# Patient Record
Sex: Female | Born: 1945 | Race: Black or African American | Hispanic: No | Smoking: Current every day smoker
Health system: Southern US, Community
[De-identification: ages and names within clinical notes are randomized; demographics above are authoritative.]

## PROBLEM LIST (undated history)

## (undated) DIAGNOSIS — R7989 Other specified abnormal findings of blood chemistry: Principal | ICD-10-CM

## (undated) DIAGNOSIS — J189 Pneumonia, unspecified organism: Secondary | ICD-10-CM

## (undated) DIAGNOSIS — M5430 Sciatica, unspecified side: Secondary | ICD-10-CM

## (undated) DIAGNOSIS — M797 Fibromyalgia: Secondary | ICD-10-CM

## (undated) DIAGNOSIS — J449 Chronic obstructive pulmonary disease, unspecified: Secondary | ICD-10-CM

## (undated) DIAGNOSIS — M545 Low back pain, unspecified: Secondary | ICD-10-CM

## (undated) DIAGNOSIS — G2581 Restless legs syndrome: Secondary | ICD-10-CM

## (undated) DIAGNOSIS — J42 Unspecified chronic bronchitis: Secondary | ICD-10-CM

## (undated) DIAGNOSIS — G8929 Other chronic pain: Secondary | ICD-10-CM

## (undated) DIAGNOSIS — F419 Anxiety disorder, unspecified: Secondary | ICD-10-CM

## (undated) DIAGNOSIS — B1920 Unspecified viral hepatitis C without hepatic coma: Secondary | ICD-10-CM

## (undated) DIAGNOSIS — Z9289 Personal history of other medical treatment: Secondary | ICD-10-CM

## (undated) DIAGNOSIS — R52 Pain, unspecified: Secondary | ICD-10-CM

## (undated) DIAGNOSIS — M1990 Unspecified osteoarthritis, unspecified site: Secondary | ICD-10-CM

## (undated) DIAGNOSIS — G43909 Migraine, unspecified, not intractable, without status migrainosus: Secondary | ICD-10-CM

## (undated) HISTORY — PX: MASS EXCISION: SHX2000

## (undated) HISTORY — DX: Personal history of other medical treatment: Z92.89

## (undated) HISTORY — PX: JOINT REPLACEMENT: SHX530

## (undated) HISTORY — PX: TIBIAL SESAMOID EXCISION: SHX2523

## (undated) HISTORY — DX: Other specified abnormal findings of blood chemistry: R79.89

## (undated) HISTORY — PX: ARTHRODESIS METATARSALPHALANGEAL JOINT (MTPJ): SHX6566

---
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
## 1979-08-30 HISTORY — PX: BILATERAL CARPAL TUNNEL RELEASE: SHX6508

## 1979-08-30 HISTORY — PX: VAGINAL HYSTERECTOMY: SUR661

## 1989-08-29 DIAGNOSIS — B1920 Unspecified viral hepatitis C without hepatic coma: Secondary | ICD-10-CM

## 1989-08-29 HISTORY — DX: Unspecified viral hepatitis C without hepatic coma: B19.20

## 1998-05-24 ENCOUNTER — Ambulatory Visit (HOSPITAL_BASED_OUTPATIENT_CLINIC_OR_DEPARTMENT_OTHER): Admission: RE | Admit: 1998-05-24 | Discharge: 1998-05-24 | Payer: Self-pay | Admitting: Surgery

## 1999-08-09 ENCOUNTER — Ambulatory Visit (HOSPITAL_COMMUNITY): Admission: RE | Admit: 1999-08-09 | Discharge: 1999-08-09 | Payer: Self-pay | Admitting: Gastroenterology

## 1999-10-22 ENCOUNTER — Ambulatory Visit (HOSPITAL_BASED_OUTPATIENT_CLINIC_OR_DEPARTMENT_OTHER): Admission: RE | Admit: 1999-10-22 | Discharge: 1999-10-22 | Payer: Self-pay | Admitting: Orthopedic Surgery

## 2000-05-08 ENCOUNTER — Emergency Department (HOSPITAL_COMMUNITY): Admission: EM | Admit: 2000-05-08 | Discharge: 2000-05-08 | Payer: Self-pay

## 2000-06-14 ENCOUNTER — Emergency Department (HOSPITAL_COMMUNITY): Admission: EM | Admit: 2000-06-14 | Discharge: 2000-06-14 | Payer: Self-pay

## 2000-12-08 ENCOUNTER — Ambulatory Visit (HOSPITAL_BASED_OUTPATIENT_CLINIC_OR_DEPARTMENT_OTHER): Admission: RE | Admit: 2000-12-08 | Discharge: 2000-12-08 | Payer: Self-pay | Admitting: Orthopedic Surgery

## 2002-04-04 ENCOUNTER — Ambulatory Visit (HOSPITAL_COMMUNITY): Admission: RE | Admit: 2002-04-04 | Discharge: 2002-04-04 | Payer: Self-pay | Admitting: Family Medicine

## 2002-04-04 ENCOUNTER — Encounter: Payer: Self-pay | Admitting: Family Medicine

## 2002-09-29 ENCOUNTER — Encounter: Admission: RE | Admit: 2002-09-29 | Discharge: 2002-09-29 | Payer: Self-pay | Admitting: Orthopaedic Surgery

## 2002-09-29 ENCOUNTER — Encounter: Payer: Self-pay | Admitting: Orthopaedic Surgery

## 2003-02-17 ENCOUNTER — Ambulatory Visit (HOSPITAL_COMMUNITY): Admission: RE | Admit: 2003-02-17 | Discharge: 2003-02-17 | Payer: Self-pay | Admitting: *Deleted

## 2003-02-17 ENCOUNTER — Encounter: Payer: Self-pay | Admitting: *Deleted

## 2003-08-23 ENCOUNTER — Encounter: Admission: RE | Admit: 2003-08-23 | Discharge: 2003-08-23 | Payer: Self-pay | Admitting: *Deleted

## 2003-08-23 ENCOUNTER — Encounter: Payer: Self-pay | Admitting: *Deleted

## 2005-02-20 ENCOUNTER — Encounter: Admission: RE | Admit: 2005-02-20 | Discharge: 2005-02-20 | Payer: Self-pay | Admitting: Internal Medicine

## 2005-06-03 ENCOUNTER — Ambulatory Visit (HOSPITAL_COMMUNITY): Admission: RE | Admit: 2005-06-03 | Discharge: 2005-06-03 | Payer: Self-pay | Admitting: Podiatry

## 2005-06-03 ENCOUNTER — Encounter (INDEPENDENT_AMBULATORY_CARE_PROVIDER_SITE_OTHER): Payer: Self-pay | Admitting: *Deleted

## 2005-06-03 ENCOUNTER — Ambulatory Visit (HOSPITAL_BASED_OUTPATIENT_CLINIC_OR_DEPARTMENT_OTHER): Admission: RE | Admit: 2005-06-03 | Discharge: 2005-06-03 | Payer: Self-pay | Admitting: Podiatry

## 2007-02-05 ENCOUNTER — Emergency Department (HOSPITAL_COMMUNITY): Admission: EM | Admit: 2007-02-05 | Discharge: 2007-02-05 | Payer: Self-pay | Admitting: Family Medicine

## 2009-01-17 ENCOUNTER — Encounter: Admission: RE | Admit: 2009-01-17 | Discharge: 2009-01-17 | Payer: Self-pay | Admitting: Internal Medicine

## 2009-01-19 ENCOUNTER — Inpatient Hospital Stay (HOSPITAL_COMMUNITY): Admission: EM | Admit: 2009-01-19 | Discharge: 2009-01-25 | Payer: Self-pay | Admitting: Emergency Medicine

## 2009-02-21 ENCOUNTER — Ambulatory Visit: Payer: Self-pay | Admitting: Pulmonary Disease

## 2009-02-21 DIAGNOSIS — J309 Allergic rhinitis, unspecified: Secondary | ICD-10-CM | POA: Insufficient documentation

## 2009-02-21 DIAGNOSIS — F17200 Nicotine dependence, unspecified, uncomplicated: Secondary | ICD-10-CM | POA: Insufficient documentation

## 2009-02-21 DIAGNOSIS — I10 Essential (primary) hypertension: Secondary | ICD-10-CM | POA: Insufficient documentation

## 2009-02-21 DIAGNOSIS — J9819 Other pulmonary collapse: Secondary | ICD-10-CM | POA: Insufficient documentation

## 2009-02-21 DIAGNOSIS — J449 Chronic obstructive pulmonary disease, unspecified: Secondary | ICD-10-CM

## 2009-08-13 ENCOUNTER — Encounter: Admission: RE | Admit: 2009-08-13 | Discharge: 2009-08-13 | Payer: Self-pay | Admitting: General Surgery

## 2009-08-16 ENCOUNTER — Encounter: Admission: RE | Admit: 2009-08-16 | Discharge: 2009-08-16 | Payer: Self-pay | Admitting: General Surgery

## 2009-08-22 ENCOUNTER — Ambulatory Visit (HOSPITAL_COMMUNITY): Admission: RE | Admit: 2009-08-22 | Discharge: 2009-08-22 | Payer: Self-pay | Admitting: General Surgery

## 2009-12-16 ENCOUNTER — Observation Stay (HOSPITAL_COMMUNITY): Admission: EM | Admit: 2009-12-16 | Discharge: 2009-12-18 | Payer: Self-pay | Admitting: Emergency Medicine

## 2009-12-17 ENCOUNTER — Encounter (INDEPENDENT_AMBULATORY_CARE_PROVIDER_SITE_OTHER): Payer: Self-pay | Admitting: Internal Medicine

## 2010-02-27 IMAGING — CR DG CHEST 2V
2 series · 2 of 2 positions shown · non-contrast
Comparison: 02/21/2009

CLINICAL DATA: Intermittent chest pain with shortness of breath.

CHEST - 2 VIEW

[w chest pa]
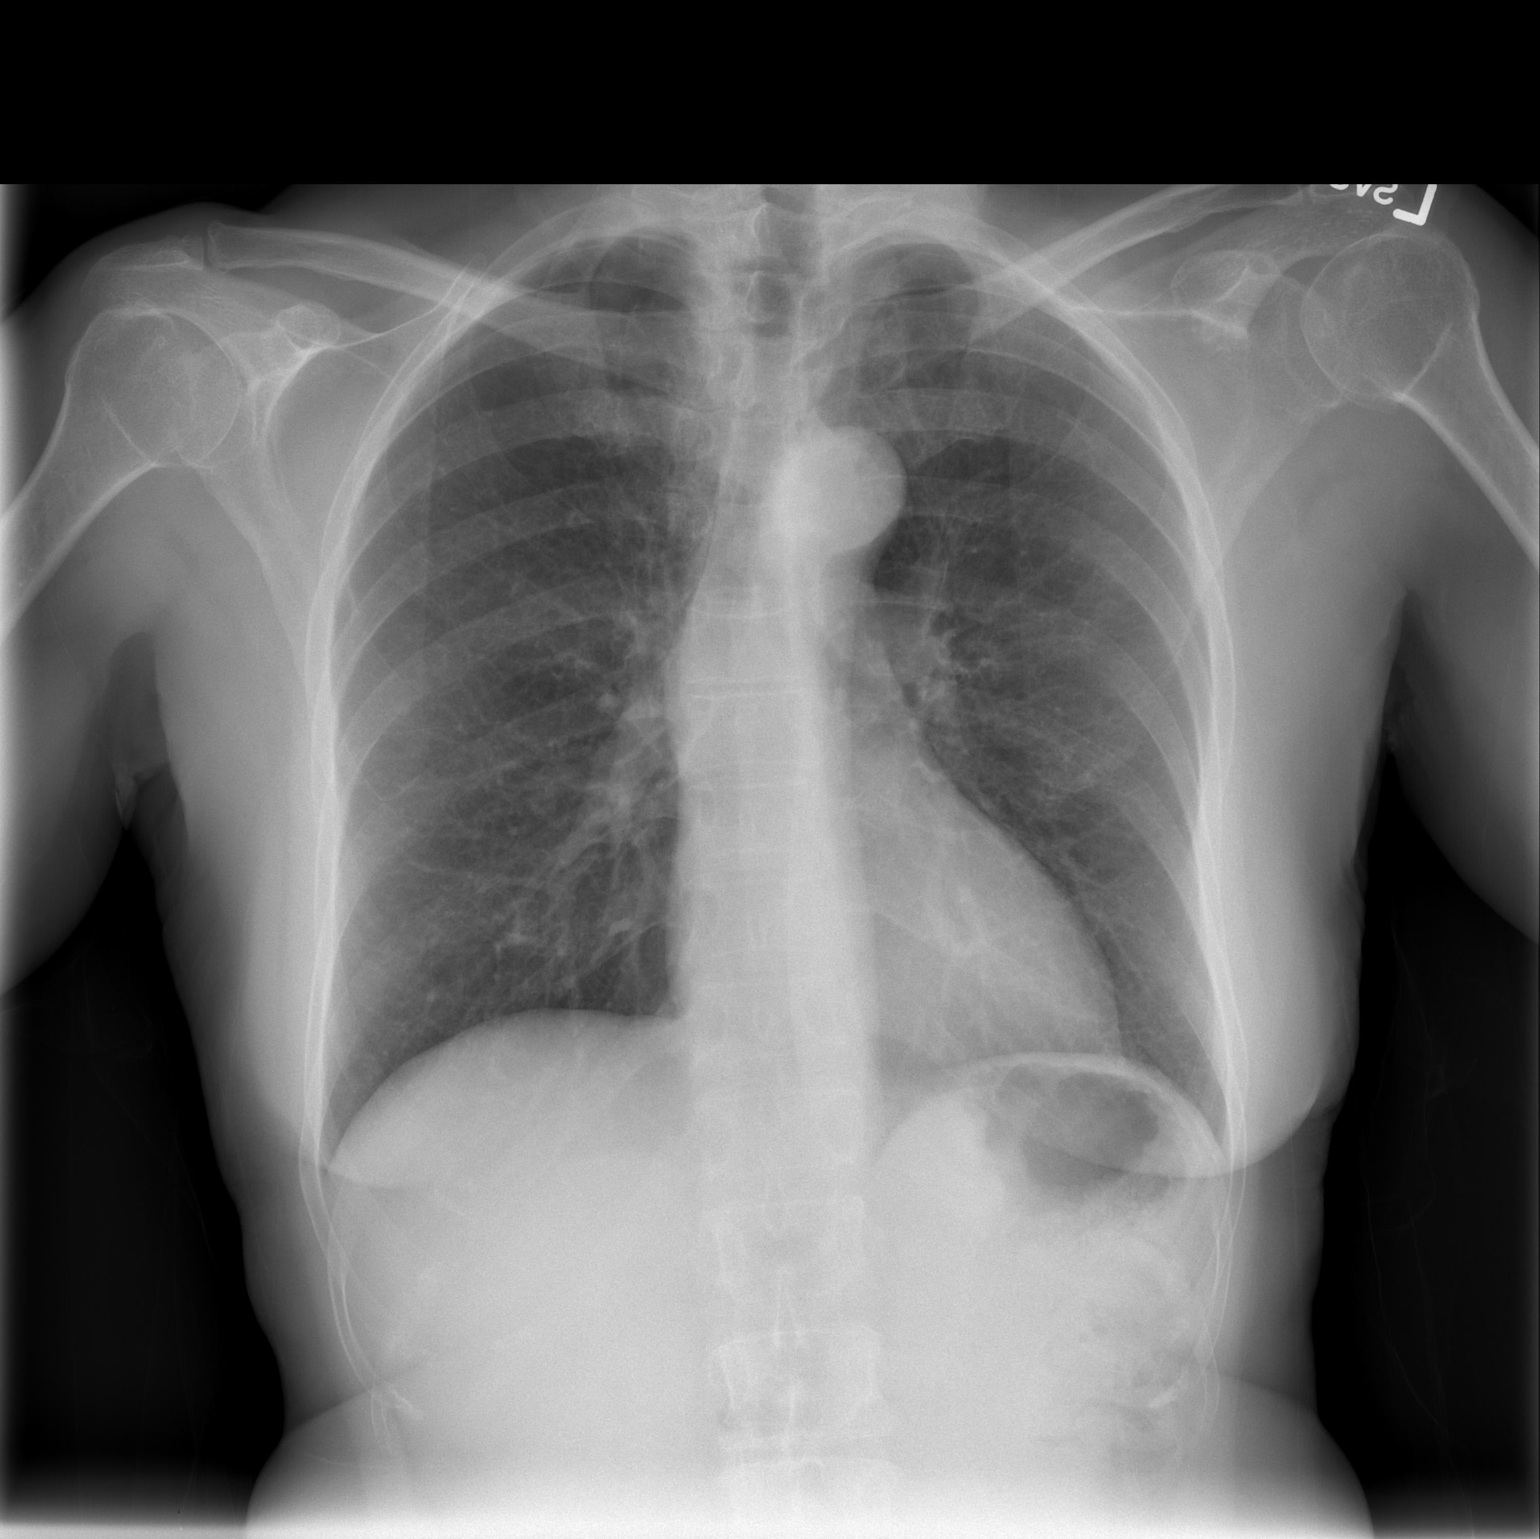

[w chest lat]
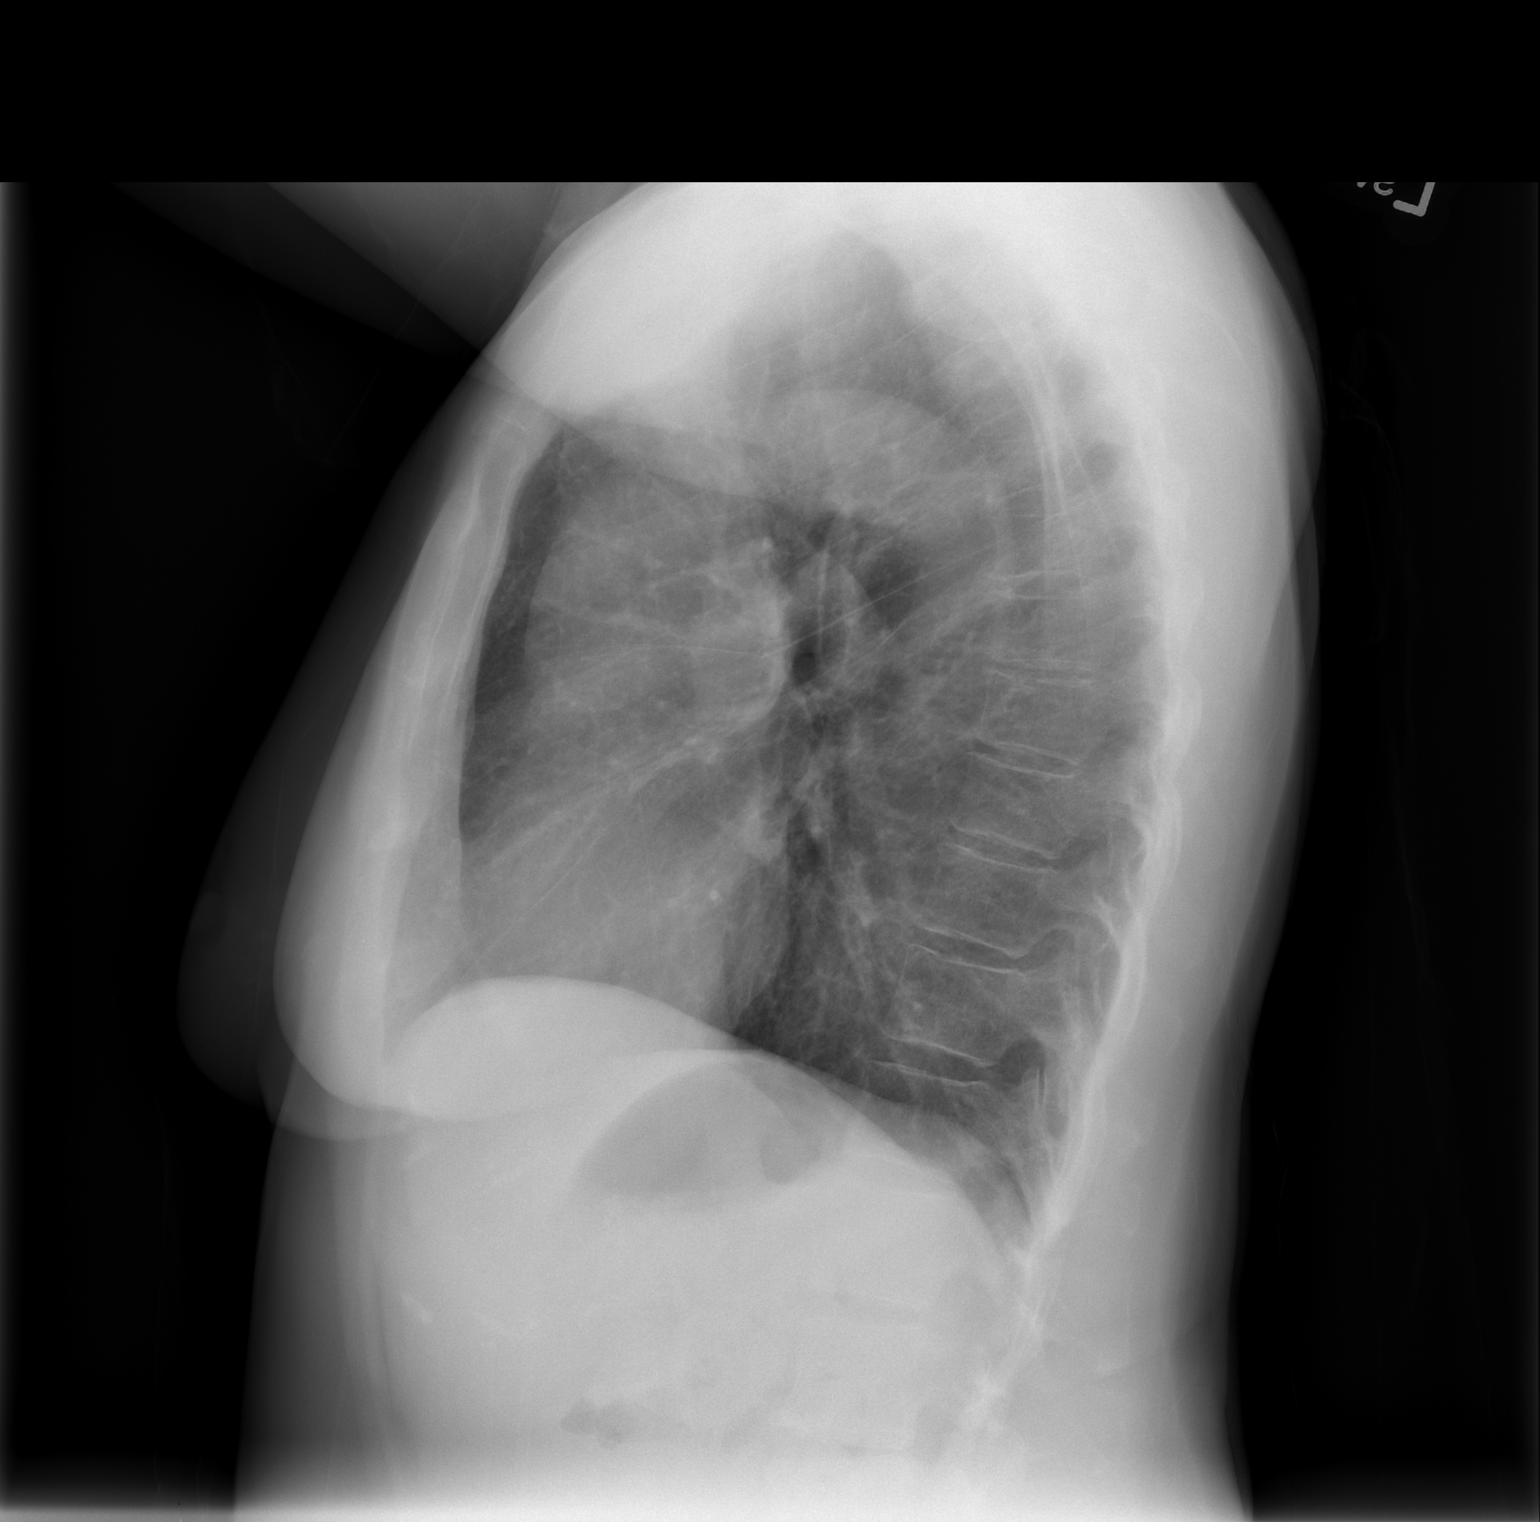

[2 of 2 positions shown; findings below may reference images not displayed]

FINDINGS: Trachea is midline.  Heart size normal.  Lungs are clear.
No pleural fluid.  COPD.
IMPRESSION: COPD without acute finding.

## 2010-03-01 ENCOUNTER — Encounter
Admission: RE | Admit: 2010-03-01 | Discharge: 2010-05-30 | Payer: Self-pay | Admitting: Physical Medicine & Rehabilitation

## 2010-03-05 ENCOUNTER — Ambulatory Visit: Payer: Self-pay | Admitting: Physical Medicine & Rehabilitation

## 2010-03-08 ENCOUNTER — Ambulatory Visit (HOSPITAL_COMMUNITY)
Admission: RE | Admit: 2010-03-08 | Discharge: 2010-03-08 | Payer: Self-pay | Admitting: Physical Medicine & Rehabilitation

## 2010-04-16 ENCOUNTER — Ambulatory Visit: Payer: Self-pay | Admitting: Physical Medicine & Rehabilitation

## 2010-07-14 ENCOUNTER — Ambulatory Visit (HOSPITAL_COMMUNITY)
Admission: RE | Admit: 2010-07-14 | Discharge: 2010-07-14 | Payer: Self-pay | Admitting: Physical Medicine and Rehabilitation

## 2011-01-01 ENCOUNTER — Encounter
Admission: RE | Admit: 2011-01-01 | Discharge: 2011-01-01 | Payer: Self-pay | Source: Home / Self Care | Attending: Internal Medicine | Admitting: Internal Medicine

## 2011-03-31 LAB — POCT I-STAT, CHEM 8
BUN: 19 mg/dL (ref 6–23)
Glucose, Bld: 109 mg/dL — ABNORMAL HIGH (ref 70–99)
Hemoglobin: 18 g/dL — ABNORMAL HIGH (ref 12.0–15.0)
TCO2: 26 mmol/L (ref 0–100)

## 2011-03-31 LAB — RAPID URINE DRUG SCREEN, HOSP PERFORMED
Amphetamines: NOT DETECTED
Barbiturates: NOT DETECTED
Benzodiazepines: NOT DETECTED
Cocaine: NOT DETECTED
Opiates: NOT DETECTED
Tetrahydrocannabinol: NOT DETECTED

## 2011-03-31 LAB — CARDIAC PANEL(CRET KIN+CKTOT+MB+TROPI)
CK, MB: 1.3 ng/mL (ref 0.3–4.0)
CK, MB: 1.3 ng/mL (ref 0.3–4.0)
CK, MB: 1.4 ng/mL (ref 0.3–4.0)
Relative Index: 1.3 (ref 0.0–2.5)
Relative Index: 1.3 (ref 0.0–2.5)
Relative Index: 1.3 (ref 0.0–2.5)
Total CK: 100 U/L (ref 7–177)
Troponin I: <0.01 ng/mL (ref 0.00–0.06)

## 2011-03-31 LAB — COMPREHENSIVE METABOLIC PANEL
AST: 37 U/L (ref 0–37)
Alkaline Phosphatase: 85 U/L (ref 39–117)
CO2: 30 mEq/L (ref 19–32)
Calcium: 10.3 mg/dL (ref 8.4–10.5)
GFR calc non Af Amer: 59 mL/min — ABNORMAL LOW (ref >60)
Sodium: 139 mEq/L (ref 135–145)

## 2011-03-31 LAB — URINALYSIS, ROUTINE W REFLEX MICROSCOPIC
Specific Gravity, Urine: 1.014 (ref 1.005–1.030)
pH: 6.5 (ref 5.0–8.0)

## 2011-03-31 LAB — CBC
MCV: 98.2 fL (ref 78.0–100.0)
Platelets: 312 10*3/uL (ref 150–400)
RBC: 4.3 MIL/uL (ref 3.87–5.11)
RDW: 13.6 % (ref 11.5–15.5)
WBC: 6 10*3/uL (ref 4.0–10.5)
WBC: 7.4 10*3/uL (ref 4.0–10.5)

## 2011-03-31 LAB — URINE MICROSCOPIC-ADD ON

## 2011-03-31 LAB — LIPID PANEL
Cholesterol: 139 mg/dL (ref 0–200)
VLDL: 7 mg/dL (ref 0–40)

## 2011-03-31 LAB — POCT CARDIAC MARKERS
CKMB, poc: <1 ng/mL — ABNORMAL LOW (ref 1.0–8.0)
CKMB, poc: <1 ng/mL — ABNORMAL LOW (ref 1.0–8.0)
Myoglobin, poc: 80.7 ng/mL (ref 12–200)
Troponin i, poc: <0.05 ng/mL (ref 0.00–0.09)

## 2011-03-31 LAB — URINE CULTURE

## 2011-03-31 LAB — TSH: TSH: 0.559 u[IU]/mL (ref 0.350–4.500)

## 2011-03-31 LAB — MAGNESIUM: Magnesium: 2.1 mg/dL (ref 1.5–2.5)

## 2011-03-31 LAB — DIFFERENTIAL
Basophils Relative: 0 % (ref 0–1)
Lymphocytes Relative: 26 % (ref 12–46)
Lymphs Abs: 1.6 10*3/uL (ref 0.7–4.0)
Neutrophils Relative %: 68 % (ref 43–77)

## 2011-03-31 LAB — PHOSPHORUS: Phosphorus: 4.3 mg/dL (ref 2.3–4.6)

## 2011-04-05 LAB — BASIC METABOLIC PANEL
BUN: 16 mg/dL (ref 6–23)
Chloride: 105 mEq/L (ref 96–112)
Creatinine, Ser: 0.88 mg/dL (ref 0.4–1.2)

## 2011-04-05 LAB — DIFFERENTIAL
Basophils Absolute: 0 10*3/uL (ref 0.0–0.1)
Basophils Relative: 1 % (ref 0–1)
Eosinophils Absolute: 0.2 10*3/uL (ref 0.0–0.7)
Lymphs Abs: 3.1 10*3/uL (ref 0.7–4.0)
Neutrophils Relative %: 50 % (ref 43–77)

## 2011-04-05 LAB — PROTIME-INR
INR: 1 (ref 0.00–1.49)
Prothrombin Time: 13.2 seconds (ref 11.6–15.2)

## 2011-04-05 LAB — CBC
MCV: 96.3 fL (ref 78.0–100.0)
Platelets: 288 10*3/uL (ref 150–400)
RDW: 14 % (ref 11.5–15.5)
WBC: 7.9 10*3/uL (ref 4.0–10.5)

## 2011-04-14 LAB — CBC
HCT: 39 % (ref 36.0–46.0)
HCT: 39 % (ref 36.0–46.0)
Hemoglobin: 13.2 g/dL (ref 12.0–15.0)
Hemoglobin: 13.3 g/dL (ref 12.0–15.0)
Hemoglobin: 13.8 g/dL (ref 12.0–15.0)
MCHC: 33.6 g/dL (ref 30.0–36.0)
Platelets: 279 10*3/uL (ref 150–400)
RBC: 3.96 MIL/uL (ref 3.87–5.11)
RBC: 4.15 MIL/uL (ref 3.87–5.11)
RBC: 4.35 MIL/uL (ref 3.87–5.11)
RDW: 14.2 % (ref 11.5–15.5)
RDW: 14.4 % (ref 11.5–15.5)
WBC: 15.1 10*3/uL — ABNORMAL HIGH (ref 4.0–10.5)
WBC: 16.1 10*3/uL — ABNORMAL HIGH (ref 4.0–10.5)
WBC: 17.6 10*3/uL — ABNORMAL HIGH (ref 4.0–10.5)

## 2011-04-14 LAB — GLUCOSE, CAPILLARY
Glucose-Capillary: 101 mg/dL — ABNORMAL HIGH (ref 70–99)
Glucose-Capillary: 103 mg/dL — ABNORMAL HIGH (ref 70–99)
Glucose-Capillary: 111 mg/dL — ABNORMAL HIGH (ref 70–99)
Glucose-Capillary: 123 mg/dL — ABNORMAL HIGH (ref 70–99)
Glucose-Capillary: 125 mg/dL — ABNORMAL HIGH (ref 70–99)
Glucose-Capillary: 153 mg/dL — ABNORMAL HIGH (ref 70–99)
Glucose-Capillary: 156 mg/dL — ABNORMAL HIGH (ref 70–99)
Glucose-Capillary: 159 mg/dL — ABNORMAL HIGH (ref 70–99)
Glucose-Capillary: 167 mg/dL — ABNORMAL HIGH (ref 70–99)
Glucose-Capillary: 172 mg/dL — ABNORMAL HIGH (ref 70–99)
Glucose-Capillary: 187 mg/dL — ABNORMAL HIGH (ref 70–99)
Glucose-Capillary: 204 mg/dL — ABNORMAL HIGH (ref 70–99)
Glucose-Capillary: 94 mg/dL (ref 70–99)

## 2011-04-14 LAB — BASIC METABOLIC PANEL
CO2: 26 mEq/L (ref 19–32)
CO2: 30 mEq/L (ref 19–32)
Calcium: 9.2 mg/dL (ref 8.4–10.5)
Calcium: 9.7 mg/dL (ref 8.4–10.5)
Calcium: 9.7 mg/dL (ref 8.4–10.5)
Creatinine, Ser: 0.89 mg/dL (ref 0.4–1.2)
Creatinine, Ser: 1.1 mg/dL (ref 0.4–1.2)
GFR calc Af Amer: >60 mL/min (ref >60)
GFR calc Af Amer: >60 mL/min (ref >60)
GFR calc Af Amer: >60 mL/min (ref >60)
GFR calc non Af Amer: 50 mL/min — ABNORMAL LOW (ref >60)
GFR calc non Af Amer: >60 mL/min (ref >60)
Potassium: 4 mEq/L (ref 3.5–5.1)
Sodium: 139 mEq/L (ref 135–145)
Sodium: 141 mEq/L (ref 135–145)

## 2011-04-14 LAB — COMPREHENSIVE METABOLIC PANEL
ALT: 35 U/L (ref 0–35)
Albumin: 3.8 g/dL (ref 3.5–5.2)
Alkaline Phosphatase: 84 U/L (ref 39–117)
BUN: 12 mg/dL (ref 6–23)
Chloride: 104 mEq/L (ref 96–112)
Glucose, Bld: 142 mg/dL — ABNORMAL HIGH (ref 70–99)
Potassium: 3.6 mEq/L (ref 3.5–5.1)
Sodium: 139 mEq/L (ref 135–145)
Total Bilirubin: 0.4 mg/dL (ref 0.3–1.2)
Total Protein: 7.4 g/dL (ref 6.0–8.3)

## 2011-04-14 LAB — BLOOD GAS, ARTERIAL
Acid-Base Excess: 2.2 mmol/L — ABNORMAL HIGH (ref 0.0–2.0)
Bicarbonate: 26 mEq/L — ABNORMAL HIGH (ref 20.0–24.0)
O2 Content: 2 L/min
O2 Saturation: 94 %
Patient temperature: 98.6
TCO2: 22.9 mmol/L (ref 0–100)

## 2011-04-14 LAB — CARDIAC PANEL(CRET KIN+CKTOT+MB+TROPI)
Relative Index: INVALID (ref 0.0–2.5)
Relative Index: INVALID (ref 0.0–2.5)
Total CK: 76 U/L (ref 7–177)
Troponin I: <0.01 ng/mL (ref 0.00–0.06)
Troponin I: <0.01 ng/mL (ref 0.00–0.06)

## 2011-04-14 LAB — CULTURE, BLOOD (ROUTINE X 2)
Culture: NO GROWTH
Culture: NO GROWTH

## 2011-04-14 LAB — BRAIN NATRIURETIC PEPTIDE: Pro B Natriuretic peptide (BNP): <30 pg/mL (ref 0.0–100.0)

## 2011-05-13 NOTE — Discharge Summary (Signed)
Jenna Stanley, Jenna Stanley                 ACCOUNT NO.:  [id number]   MEDICAL RECORD NO.:  [id number]          PATIENT TYPE:  INP   LOCATION:  1415                         FACILITY:  [facility]   PHYSICIAN:  Hillery Aldo, M.D.   DATE OF BIRTH:  [birth year]   DATE OF ADMISSION:  01/19/2009  DATE OF DISCHARGE:                               DISCHARGE SUMMARY   PRIMARY CARE PHYSICIAN:  Dr. Dorothyann Peng.   DISCHARGE DIAGNOSES:  1. Chronic obstructive pulmonary disease with acute exacerbation.  2. Tobacco abuse.  3. Hypertension.  4. Chronic pain.  5. Sick euthyroid syndrome, followup thyroid studies recommended in 6      weeks.  6. Steroid-induced hyperglycemia.  7. Leukocytosis.   DISCHARGE MEDICATIONS:  1. Fentanyl patch 100 mcg transdermally every 3 days.  2. Nucynta 100 mg every 6 hours p.r.n. pain.  3. Requip 2 mg q.h.s.  4. Valium 5 mg q.h.s.  5. Spiriva 1 inhalation daily.  6. Hydrochlorothiazide 25 mg daily.  7. Norvasc 10 mg daily.  8. Prednisone 60 mg, taper by 10 mg daily until off.  9. Mucinex 600 mg b.i.d. p.r.n.  10.Albuterol HFA 2 puffs every 4 hours p.r.n.  11.Avelox 400 mg daily through January 26, 2009.   CONSULTATIONS:  None.   BRIEF ADMISSION HPI:  Patient is a 65 year old female with past medical  history of chronic obstructive pulmonary disease and ongoing tobacco  abuse who presented to the hospital with a chief complaint of  progressive dyspnea accompanied by worsening wheeze and a nonproductive  cough.  Upon initial evaluation in the emergency department, she was  noted to have a blood pressure of 175/88 and was dyspneic with diffuse  expiratory wheezes and therefore was admitted for further evaluation and  treatment.  For the full details, please see the dictated report done by  Dr. Flonnie Overman.   PROCEDURES AND DIAGNOSTIC STUDIES:  1. Chest x-ray on January 17, 2009, showed mild left basilar      atelectasis.  2. Chest x-ray on January 19, 2009, showed no active  cardiopulmonary      disease.  3. Chest x-ray on January 23, 2009, showed minimal plate-like      atelectasis at the right lung base.  Lungs otherwise clear.  Heart      larger but still within normal limits.  No congestive heart      failure.   DISCHARGE LABORATORY VALUES:  Sodium was 139, potassium 4.0, chloride  100, bicarb 30, BUN 15, creatinine 1.1, glucose 122.  White blood cell  count was 14.1, hemoglobin 14.4, hematocrit 42.8, platelets 311.   HOSPITAL COURSE BY PROBLEM:  1. COPD exacerbation:  Patient was admitted and put on high-dose Solu-      Medrol, empiric antibiotics consisting of Rocephin and      azithromycin, nebulized bronchodilator treatments, as well as      Mucinex.  Serial chest radiographs failed to demonstrate any      evidence of pneumonia.  She did require supplemental oxygen.  She      had persistent wheezing until January 24, 2009, when  her lung exam      began to improve dramatically.  At that time, the patient was begun      on a steroid taper and her IV antibiotics were switched to p.o.      Avelox.  She continues to complain of feeling a little wheezy at      times and has an occasional nonproductive cough.  Nonetheless, she      is stable enough for discharge and will be discharged home on a      combination of p.o. Avelox, prednisone taper, albuterol HFA, and      ongoing therapy with Mucinex.  She has a followup appointment      scheduled with her primary care physician, Dr. Allyne Gee, next week.  2. Tobacco abuse:  The patient was counseled extensively with regard      to the need for tobacco cessation given her underlying lung      disease.  She agrees that she needs to quit smoking and is      motivated to do so.  She was put on a nicotine patch during the      course of her hospital stay.  3. Hypertension:  Patient had marked elevation of her blood pressure      upon initial presentation.  She subsequently was started on      clonidine.  Norvasc  was subsequently added due to inadequate blood      pressure control and finally hydrochlorothiazide was added.  Her      discharge blood pressure is 110/69.  We do anticipate that her      blood pressure will drop further with steroid taper and therefore      we will be discharging her on Norvasc and hydrochlorothiazide      alone.  Consideration as to if restarting clonidine is warranted      can be made by her primary care physician at her hospital followup      appointment next week.  4. Chronic pain:  Patient's chronic pain is managed with a Duragesic      patch and Nucynta in the outpatient environment.  The Duragesic      patch was continued while she was in the hospital and oxycodone was      provided as needed for breakthrough pain.  5. Sick euthyroid syndrome:  Patient had a depressed TSH and a low      free T4.  She is not complaining of any symptoms of hypo or      hyperthyroidism at this time.  She should have followup thyroid      studies done in 6 weeks' time to ensure that these have normalized.  6. Steroid-induced hyperglycemia:  Patient was started on sliding      scale insulin while in the hospital.  As her Solu-Medrol has been      tapered, her sugars have come down quite a bit.  She should not      need any further therapy with complete tapering of steroids.  7. Leukocytosis:  Patient did have leukocytosis on initial      presentation.  Blood cultures were negative.  Her white blood cell      count reached a high value of 17.6 and has come steadily down      subsequent to this.  She will complete a full 1-week course of      therapy with Avelox.   DISPOSITION:  Patient is medically stable for  discharge and will be  discharged home today.  She is instructed to follow up with her primary  care physician, Dr. Allyne Gee, at her regularly scheduled appointment time  next week.   TIME SPENT COORDINATING CARE FOR DISCHARGE AND DISCHARGE INSTRUCTION:  Equals 35  minutes.      Hillery Aldo, M.D.  Electronically Signed     CR/MEDQ  D:  01/25/2009  T:  01/25/2009  Job:  [id number]   cc:   Candyce Churn. Allyne Gee, M.D.  Fax: [phone]

## 2011-05-13 NOTE — H&P (Signed)
Jenna Stanley, Jenna Stanley NO.:  [id number]   MEDICAL RECORD NO.:  [id number]          PATIENT TYPE:  EMS   LOCATION:  ED                           FACILITY:  [facility]   PHYSICIAN:  Lucita Ferrara, MD         DATE OF BIRTH:  1946/08/05   DATE OF ADMISSION:  01/19/2009  DATE OF DISCHARGE:                              HISTORY & PHYSICAL   PRIMARY CARE DOCTOR:  Unassigned.   Patient is a 65 year old who presents with shortness of breath  accompanied by wheezing for the last 4 days accompanied by cough.  Cough  nonproductive.  No fevers.  The patient denies any nausea or vomiting.  The patient has a history of chronic bronchitis and COPD.  She is a  chronic smoker..  She denies any pleuritis, heavy type of chest pain.  She continues to smoke.  She denies recent travel.  She denies any  orthopnea, paroxysmal nocturnal dyspnea or lower extremity edema.  She  has no sick contacts.   PAST MEDICAL HISTORY:  Significant for:  1. Chronic back pain on  2. Chronic bronchitis.  3. Restless leg syndrome.  4. Anxiety.   FAMILY HISTORY:  Noncontributory.  She denies any family history of  acute coronary syndrome.  She denies cardiac procedures.   SOCIAL HISTORY:  Currently smokes one half-pack per day.  Denies drugs  or alcohol.  No recent travel.   ALLERGIES:  No known drug allergies.   MEDICATIONS AT HOME:  Include  1. Duragesic patch 100 mcg for 24 hours.  2. Requip   1. Valium 5 mg p.o. at bedtime.  2. She takes another medication for breakthrough pain which she does      not know the name of.   REVIEW OF SYSTEMS:  As per HPI, otherwise negative.   PHYSICAL EXAMINATION:  GENERAL:  The patient is in no acute distress.  VITAL SIGNS:  Blood pressure is 175/88, pulse 70, respirations 15,  temperature 97.8, pulse ox 98% on 2 L nasal cannula.  HEENT: Normocephalic, atraumatic.  Sclerae are anicteric.  Neck: Supple.  No JVD or carotid bruits.  PERRLA.  Extraocular muscles  intact.  CARDIOVASCULAR:  S1, S2.  No murmurs, gallops, rubs, clicks.  LUNGS: Clear with bilateral expiratory wheezes.  Use of accessory  muscles obviously noted.  There are no rales or rhonchi.  There is no  pain upon palpation of the chest.  ABDOMEN:  Soft, nontender, nondistended.  Positive bowel sounds.  No  hepatosplenomegaly.  EXTREMITIES: No clubbing, cyanosis or edema.  NEURO:  Patient is anxious yet alert, oriented x3.  Cranial nerves II  through XII are grossly intact.   EMERGENCY ROOM COURSE:  Albuterol/Atrovent given. The patient did not  receive any Solu-Medrol. Chest x-ray shows no active cardiopulmonary  disease.  No pneumothorax.  Lungs clear.   LABORATORY DATA:  None.   ASSESSMENT AND PLAN:  The patient is 65 year old with:  1. Dyspnea likely from chronic obstructive pulmonary disease      exacerbation, rule out acute coronary syndrome.  Rule out  pneumonia, unlikely given chest x-ray findings noted.  But CBC      within normal in the emergency room.  There is no leukocytosis.  2. Tobacco abuse.   DISCUSSION AND PLAN:  A full workup was not completed in the emergency  room.  Will need to get a CBC, CMP, magnesium phosphorus, and ABG stat.  Will admit the patient to the step-down unit.  Will empirically start  the patient on Solu-Medrol IV and oxygen and if ABG shows respiratory  acidosis with high pCO2 will put the patient on BiPAP.  Nebulizer  treatments intermittently.  Empiric antibiotics.  EKG and cardiac  enzymes will follow.  Smoking cessation. DVT and GI prophylaxis. For now  these are the plans.  The patient may require an echo or further workup.      Lucita Ferrara, MD  Electronically Signed     RR/MEDQ  D:  01/19/2009  T:  01/19/2009  Job:  [id number]

## 2011-05-13 NOTE — Op Note (Signed)
NAME:  Jenna Stanley, Jenna Stanley                 ACCOUNT NO.:  [id number]   MEDICAL RECORD NO.:  [id number]          PATIENT TYPE:  AMB   LOCATION:  SDS                          FACILITY:  [facility]   PHYSICIAN:  Gaynelle Adu, MD        DATE OF BIRTH:  May 04, 1946   DATE OF PROCEDURE:  08/22/2009  DATE OF DISCHARGE:  08/22/2009                               OPERATIVE REPORT   PREOPERATIVE DIAGNOSIS:  Right anterior shoulder mass.   POSTOPERATIVE DIAGNOSIS:  Right anterior shoulder mass.   PROCEDURE:  Excision of right anterior subcutaneous shoulder mass.   SURGEON:  Mary Sella. Andrey Campanile, MD   ANESTHESIA:  Monitored anesthesia care plus 30 mL of local consisting of  0.25% Marcaine with epinephrine.   FINDINGS:  Lobulated right anterior subcutaneous shoulder mass extending  down to the deltoid muscle.   SPECIMEN:  Right anterior shoulder mass which was sent to pathology.   ESTIMATED BLOOD LOSS:  Minimal.   COMPLICATIONS:  None immediately.   INDICATIONS FOR PROCEDURE:  [name] is a very pleasant 65 year-old  African female who presented to the office complaining of a right  anterior shoulder mass for over a year.  She felt that it had increased  in size over the past month and was concerned about the lesion.  She  most recently started complaining of some numbness overlying the skin of  the lesion.  She denied any other constitutional symptoms.  She  underwent ultrasound which revealed a 6.7 x 1.8 x 5.4 cm mass anterior  to the right shoulder musculature.  It had some underlying dentations to  the musculature which was concerning.  She therefore underwent MRI on  August 16, 2009 which demonstrated homogeneous mass over the deltoid and  part of the pectoralis muscle which was consistent with a lipoma.  The  risks and benefits of proposed procedure were explained in detail to the  patient including bleeding, infection, injury to surrounding structures,  seroma, hematoma, scarring, numbness, need for  additional procedures and  she elected to proceed to surgery.   DESCRIPTION OF PROCEDURE:  The patient was brought to the operating room  and placed supine on the operating room table.  Monitored anesthesia  care was established.  A surgical time-out was performed.  She received  1 gram of Ancef prior to skin incision.  Her arm and right chest was  prepped and draped in the usual standard surgical fashion.  A 5-cm  incision was made with a #15 blade after the dermis had been infiltrated  with local.  The subcutaneous tissue which was very thin was divided  with electrocautery.  Then using skin hooks, flaps were raised both  medial and lateral.  We were then able to shell out the mass in its  entirety.  This was done with electrocautery.  The mass extended all the  way down to the level of the deltoid muscle.  It was circumferentially  excised.  It was passed off the field and sent to pathology for  analysis.  The wound was irrigated and hemostasis was  achieved.  The  deep dermis was reapproximated with interrupted inverted 3-0 Vicryls in  an interrupted fashion.  The skin was then reapproximated with  interrupted 3-0 nylon sutures in a vertical mattress fashion.  Additional local was infiltrated for  a total use of 30 mL of 0.25% Marcaine.  The wound was washed off and  then a 4x4 and translucent dressing was applied.  She tolerated the  procedure well.  There were no immediate complications.  She was  transported to the recovery room in stable condition.      Gaynelle Adu, MD  Electronically Signed     EW/MEDQ  D:  08/22/2009  T:  08/23/2009  Job:  [id number]

## 2011-05-16 NOTE — Op Note (Signed)
NAME:  Jenna Stanley, Jenna Stanley                 ACCOUNT NO.:  [id number]   MEDICAL RECORD NO.:  [id number]          PATIENT TYPE:  AMB   LOCATION:  DSC                          FACILITY:  [facility]   PHYSICIAN:  Ezequiel Kayser. Ajlouny, D.P.M.DATE OF BIRTH:  [birth year]   DATE OF PROCEDURE:  06/03/2005  DATE OF DISCHARGE:                                 OPERATIVE REPORT   SURGEON:  Ezequiel Kayser. Ajlouny, D.P.M.   ASSISTANT:  None.   PREOPERATIVE DIAGNOSIS:  1.  Hallux rigidus, left foot.  2.  Painful arthritic tibial sesamoid, left foot.   POSTOPERATIVE DIAGNOSIS:  1.  Hallux rigidus, left foot.  2.  Painful arthritic tibial sesamoid, left foot.   PROCEDURE:  1.  Cheilectomy with first MTP joint implant, left foot.  2.  Excision arthritic tibial sesamoid, left foot.   ANESTHESIA:  MAC with local.   COMPLICATIONS:  None.   PATHOLOGY:  Tibial sesamoid.   HEMOSTASIS:  Pneumatic ankle tourniquet inflated to 250 mmHg.   ESTIMATED BLOOD LOSS:  Minimal.   DESCRIPTION OF PROCEDURE:  The patient was brought to the OR and placed in  the supine position at which time monitored anesthesia care was  administered.  A local block was performed with a 1 to 1 mixture of 0.5%  Marcaine plain and 2% lidocaine plain.  A well padded pneumatic ankle  tourniquet was applied superior to the medial malleolus.  The patient was  prepped and draped in the usual aseptic manner.  The foot was exsanguinated  with an Esmarch bandage and the previously applied tourniquet inflated to  250 mmHg.  Additional local was needed and this was infiltrated around the  first ray of the left foot.   Attention was directed to the first to the plantar aspect of the foot where  a linear incision was made in the area of the palpable, painful sesamoid.  The incision was deepened via sharp and blunt modalities, taking care to  clamp and cauterize all bleeding vessels and insuring retraction of all  neurovascular structures encountered.  The  deep and superficial fascia was  separated medially and laterally the length of the incision.  The flexor  hallucis longus tendon was identified and protected throughout the entire  case.  The tibial sesamoid was large and arthritic.  At this time, care was  taken to carefully excise the tibial sesamoid in total which had a moderate  amount of surrounding scar tissue and was partially fused to the plantar  aspect of the metatarsal head.  It was excised in total and sent to  pathology for evaluation.  There were erosions at the plantar aspect of the  first metatarsal head.  The surgical wound was copiously irrigated with  sterile saline and antibiotic solution.  The closure was accomplished using  3-0 Vicryl.  Subcutaneous closure was accomplished using 4-0 Vicryl and skin  closure was accomplished using 3-0 nylon in a vertical mattress fashion.   Next, attention was directed to the dorsal first ray where a dorsal linear  incision was made.  The incision was deepened via sharp  and blunt  modalities, taking care to clamp and cauterize all bleeding vessels and  insuring retraction of all neurovascular structures encountered.  The deep  and superficial fascia was separated medially and laterally the length of  the incision.  Meticulous care was taken to avoid inadvertent release of the  insertion of the flexor hallicis brevis and the adductor and abductor  tendons of the great toe.  Approximately 6 mm of the proximal phalanx was  resected utilizing a sagittal saw removing only sufficient bone to avoid  excessive joint tension or prosthetic overlapping.  There was a moderate to  severe amount of first MTP joint arthritic changes with hypertrophic bone  lipping.  It was needed to take this much bone to avoid excessive joint  tension.  The cut was made parallel to the plain concavity of the phalangeal  articular surface.  The medial eminence was resected off the head of the  first metatarsal, the  lateral aspect of the first metatarsal, and all  osteophytes dorsally.  The head of the first metatarsal was remodeled  utilizing the sagittal saw, power rasp, rotary bur, and hand rasp.  There  was also a moderate amount of scar tissue which also made it difficult to  resect the bone but it was achieved successfully.  Care was insured that the  metatarsal head was round so that there were no restrictions of the first  MTP joint range of motion.  Using the sagittal guide implant, the size was  determined to be a small.  The guide was used as a punch guide for the pin  of the trial prosthesis.  A small bur was used to create the canal and the  sizer was placed.  The first MTP joint was put through a range of motion,  dorsiflexion and plantar flexion were greatly improved.  There were no  restrictions, range of motion was smooth.  Once the appropriate size was  determined, the properly sized implant was inserted and completely seated  into the proximal phalanx.  The joint was reduced and examined for tension  and motion.  The hallux dorsiflexion was approximately 30 degrees.  The  range of motion was concentric and unimpinged.  Interoperative radiographs  were taken to insure placement of the implant.  The surgical wound was  copiously irrigated with sterile saline and antibiotic solution.  Deep  closure was accomplished.  Because the patient had previously had surgery  and there was an extensive amount of scar tissue, the capsule was also in  very poor condition.  There was minimal capsule to reapproximate but what  was remaining and healthy was reapproximated utilizing 3-0 Vicryl.  Subcutaneous closure was accomplished using 4-0 Vicryl.  Skin closure was  accomplished using 4-0 nylon in a horizontal mattress fashion.  The foot was  dressed with Steri-Strips, Xeroform, 4 by 4s, Kling and Coban.  The tourniquet was deflated and vascular status returned to all digits.  The  patient was sent to  the recovery room with vital signs stable and capillary  refill time presurgical levels.  Both written and oral postoperative  instructions were given to the patient.  A postoperative shoe dispensed.  All questions answered.  No guarantees given.  The patient is to be non-  weight-bearing.      MJA/MEDQ  D:  06/03/2005  T:  06/03/2005  Job:  [id number]

## 2012-12-29 DIAGNOSIS — R778 Other specified abnormalities of plasma proteins: Secondary | ICD-10-CM

## 2012-12-29 DIAGNOSIS — R7989 Other specified abnormal findings of blood chemistry: Secondary | ICD-10-CM

## 2012-12-29 HISTORY — DX: Other specified abnormalities of plasma proteins: R77.8

## 2012-12-29 HISTORY — DX: Other specified abnormal findings of blood chemistry: R79.89

## 2012-12-31 ENCOUNTER — Other Ambulatory Visit: Payer: Self-pay | Admitting: Internal Medicine

## 2012-12-31 ENCOUNTER — Ambulatory Visit
Admission: RE | Admit: 2012-12-31 | Discharge: 2012-12-31 | Disposition: A | Discharge status: Home / Self Care | Payer: Medicare Other | Source: Ambulatory Visit | Attending: Internal Medicine | Admitting: Internal Medicine

## 2012-12-31 DIAGNOSIS — R319 Hematuria, unspecified: Secondary | ICD-10-CM

## 2013-01-03 ENCOUNTER — Observation Stay (HOSPITAL_COMMUNITY)
Admission: EM | Admit: 2013-01-03 | Discharge: 2013-01-05 | Disposition: A | Discharge status: Home / Self Care | Payer: Medicare Other | Attending: Internal Medicine | Admitting: Internal Medicine

## 2013-01-03 ENCOUNTER — Encounter (HOSPITAL_COMMUNITY): Payer: Self-pay | Admitting: Cardiology

## 2013-01-03 ENCOUNTER — Emergency Department (HOSPITAL_COMMUNITY): Payer: Medicare Other

## 2013-01-03 DIAGNOSIS — R5383 Other fatigue: Secondary | ICD-10-CM | POA: Insufficient documentation

## 2013-01-03 DIAGNOSIS — J449 Chronic obstructive pulmonary disease, unspecified: Secondary | ICD-10-CM

## 2013-01-03 DIAGNOSIS — R5381 Other malaise: Principal | ICD-10-CM | POA: Insufficient documentation

## 2013-01-03 DIAGNOSIS — J309 Allergic rhinitis, unspecified: Secondary | ICD-10-CM

## 2013-01-03 DIAGNOSIS — R509 Fever, unspecified: Secondary | ICD-10-CM | POA: Insufficient documentation

## 2013-01-03 DIAGNOSIS — R0989 Other specified symptoms and signs involving the circulatory and respiratory systems: Secondary | ICD-10-CM | POA: Insufficient documentation

## 2013-01-03 DIAGNOSIS — R799 Abnormal finding of blood chemistry, unspecified: Secondary | ICD-10-CM | POA: Insufficient documentation

## 2013-01-03 DIAGNOSIS — I10 Essential (primary) hypertension: Secondary | ICD-10-CM | POA: Insufficient documentation

## 2013-01-03 DIAGNOSIS — Z79899 Other long term (current) drug therapy: Secondary | ICD-10-CM | POA: Insufficient documentation

## 2013-01-03 DIAGNOSIS — F17200 Nicotine dependence, unspecified, uncomplicated: Secondary | ICD-10-CM | POA: Insufficient documentation

## 2013-01-03 DIAGNOSIS — R259 Unspecified abnormal involuntary movements: Secondary | ICD-10-CM | POA: Insufficient documentation

## 2013-01-03 DIAGNOSIS — R0609 Other forms of dyspnea: Secondary | ICD-10-CM | POA: Insufficient documentation

## 2013-01-03 DIAGNOSIS — R7989 Other specified abnormal findings of blood chemistry: Secondary | ICD-10-CM

## 2013-01-03 DIAGNOSIS — J9819 Other pulmonary collapse: Secondary | ICD-10-CM

## 2013-01-03 DIAGNOSIS — J4489 Other specified chronic obstructive pulmonary disease: Secondary | ICD-10-CM | POA: Insufficient documentation

## 2013-01-03 DIAGNOSIS — I059 Rheumatic mitral valve disease, unspecified: Secondary | ICD-10-CM | POA: Insufficient documentation

## 2013-01-03 HISTORY — DX: Chronic obstructive pulmonary disease, unspecified: J44.9

## 2013-01-03 LAB — CBC
MCV: 99 fL (ref 78.0–100.0)
Platelets: 303 10*3/uL (ref 150–400)
RBC: 3.84 MIL/uL — ABNORMAL LOW (ref 3.87–5.11)
WBC: 12.4 10*3/uL — ABNORMAL HIGH (ref 4.0–10.5)

## 2013-01-03 LAB — BASIC METABOLIC PANEL
CO2: 26 mEq/L (ref 19–32)
Chloride: 104 mEq/L (ref 96–112)
GFR calc Af Amer: >90 mL/min (ref >90)
Potassium: 4.6 mEq/L (ref 3.5–5.1)
Sodium: 138 mEq/L (ref 135–145)

## 2013-01-03 LAB — POCT I-STAT TROPONIN I: Troponin i, poc: 0.11 ng/mL (ref 0.00–0.08)

## 2013-01-03 LAB — URINALYSIS, ROUTINE W REFLEX MICROSCOPIC
Glucose, UA: NEGATIVE mg/dL
Leukocytes, UA: NEGATIVE
Nitrite: NEGATIVE
Specific Gravity, Urine: 1.03 (ref 1.005–1.030)
pH: 5 (ref 5.0–8.0)

## 2013-01-03 LAB — PRO B NATRIURETIC PEPTIDE: Pro B Natriuretic peptide (BNP): 625.6 pg/mL — ABNORMAL HIGH (ref 0–125)

## 2013-01-03 LAB — URINE MICROSCOPIC-ADD ON

## 2013-01-03 LAB — HEPATIC FUNCTION PANEL
ALT: 16 U/L (ref 0–35)
Albumin: 3.5 g/dL (ref 3.5–5.2)
Alkaline Phosphatase: 79 U/L (ref 39–117)
Total Protein: 7.1 g/dL (ref 6.0–8.3)

## 2013-01-03 LAB — CG4 I-STAT (LACTIC ACID): Lactic Acid, Venous: 0.9 mmol/L (ref 0.5–2.2)

## 2013-01-03 MED ORDER — IPRATROPIUM BROMIDE 0.02 % IN SOLN: 0.5000 mg | RESPIRATORY_TRACT | Status: DC | PRN

## 2013-01-03 MED ORDER — ROPINIROLE HCL 1 MG PO TABS
2.0000 mg | ORAL_TABLET | Freq: Every day | ORAL | Status: DC
  Administered 2013-01-03 – 2013-01-04 (×2): 2 mg via ORAL
  Filled 2013-01-03 (×3): qty 2

## 2013-01-03 MED ORDER — SODIUM CHLORIDE 0.9 % IV BOLUS (SEPSIS)
1000.0000 mL | Freq: Once | INTRAVENOUS | Status: AC
  Administered 2013-01-03: 1000 mL via INTRAVENOUS

## 2013-01-03 MED ORDER — SODIUM CHLORIDE 0.9 % IJ SOLN
3.0000 mL | Freq: Two times a day (BID) | INTRAMUSCULAR | Status: DC
  Administered 2013-01-04 (×2): 3 mL via INTRAVENOUS

## 2013-01-03 MED ORDER — GABAPENTIN 300 MG PO CAPS
300.0000 mg | ORAL_CAPSULE | Freq: Every day | ORAL | Status: DC
  Administered 2013-01-03 – 2013-01-04 (×2): 300 mg via ORAL
  Filled 2013-01-03 (×3): qty 1

## 2013-01-03 MED ORDER — ALBUTEROL SULFATE (5 MG/ML) 0.5% IN NEBU: 2.5000 mg | INHALATION_SOLUTION | RESPIRATORY_TRACT | Status: DC | PRN

## 2013-01-03 MED ORDER — ACETAMINOPHEN 500 MG PO TABS
1000.0000 mg | ORAL_TABLET | Freq: Once | ORAL | Status: AC
  Administered 2013-01-03: 1000 mg via ORAL
  Filled 2013-01-03: qty 2

## 2013-01-03 MED ORDER — HEPARIN SODIUM (PORCINE) 5000 UNIT/ML IJ SOLN
5000.0000 [IU] | Freq: Three times a day (TID) | INTRAMUSCULAR | Status: DC
  Administered 2013-01-03 – 2013-01-05 (×4): 5000 [IU] via SUBCUTANEOUS
  Filled 2013-01-03 (×8): qty 1

## 2013-01-03 MED ORDER — SODIUM CHLORIDE 0.9 % IJ SOLN: 3.0000 mL | INTRAMUSCULAR | Status: DC | PRN

## 2013-01-03 MED ORDER — TIOTROPIUM BROMIDE MONOHYDRATE 18 MCG IN CAPS
18.0000 ug | ORAL_CAPSULE | Freq: Every day | RESPIRATORY_TRACT | Status: DC
  Administered 2013-01-04 – 2013-01-05 (×2): 18 ug via RESPIRATORY_TRACT
  Filled 2013-01-03: qty 5

## 2013-01-03 MED ORDER — SODIUM CHLORIDE 0.9 % IV SOLN: 250.0000 mL | INTRAVENOUS | Status: DC | PRN

## 2013-01-03 MED ORDER — CITALOPRAM HYDROBROMIDE 20 MG PO TABS
20.0000 mg | ORAL_TABLET | Freq: Every day | ORAL | Status: DC
  Administered 2013-01-04 – 2013-01-05 (×2): 20 mg via ORAL
  Filled 2013-01-03 (×2): qty 1

## 2013-01-03 MED ORDER — MORPHINE SULFATE ER 100 MG PO TBCR
100.0000 mg | EXTENDED_RELEASE_TABLET | Freq: Two times a day (BID) | ORAL | Status: DC
  Administered 2013-01-03 – 2013-01-05 (×4): 100 mg via ORAL
  Filled 2013-01-03 (×4): qty 1

## 2013-01-03 MED ORDER — SODIUM CHLORIDE 0.9 % IJ SOLN: 3.0000 mL | Freq: Two times a day (BID) | INTRAMUSCULAR | Status: DC

## 2013-01-03 NOTE — ED Notes (Addendum)
Pt reports generalized weakness, lethargy, fever, chills started today. Friend states pt has had some intermittent tremors,  Confusion & has been sleepy all day. Denies n/v/d, CP, SOB, abd pain, cold, cough, nasal congestion, sore throat, h/a, dizziness. Denies pain anywhere. Resp e/u, no distress. Noted POX on RA 89%, placed on 2L [place] with increase. Pt A&OX4

## 2013-01-03 NOTE — ED Notes (Signed)
Pt reports increased generalized weakness and tremors that started earlier today. Denies any cough or fever. States she talked to PCP and was informed to come to ED. No neuro deficits noted.Pt A&Ox4.

## 2013-01-03 NOTE — ED Notes (Signed)
Returned from  X-ray

## 2013-01-03 NOTE — ED Notes (Signed)
Attempted to call report.  Was told rn would return my call 

## 2013-01-03 NOTE — ED Notes (Signed)
Pt reports relief with the 2L

## 2013-01-03 NOTE — ED Provider Notes (Signed)
History     CSN: [id number]  Arrival date & time 01/03/13  1728   None     Chief Complaint  Patient presents with  . Weakness  . Tremors     HPI chief complaint: Generalized weakness. Onset: Today. Location: Generalized. Not improved or worsened by anything. Context: Instructed by PCP to come. Severity: Mild. Regarding social history see nurse's notes. For signs and symptoms the review of systems. No family history of febrile or viral illnesses. I have reviewed patient's past medical, past surgical, social history as well as medications and allergies.  Past Medical History  Diagnosis Date  . Back pain   . COPD (chronic obstructive pulmonary disease)   . Bronchitis     History reviewed. No pertinent past surgical history.  History reviewed. No pertinent family history.  History  Substance Use Topics  . Smoking status: Current Every Day Smoker  . Smokeless tobacco: Not on file  . Alcohol Use: No    OB History    Grav Para Term Preterm Abortions TAB SAB Ect Mult Living                  Review of Systems  Constitutional: Negative for fever and chills.  HENT: Negative for trouble swallowing, neck pain and neck stiffness.   Eyes: Negative for pain, discharge and itching.  Respiratory: Negative for cough, chest tightness and shortness of breath.   Cardiovascular: Negative for chest pain, palpitations and leg swelling.  Gastrointestinal: Negative for nausea, vomiting, abdominal pain, diarrhea, constipation and blood in stool.  Genitourinary: Negative for dysuria, urgency, frequency, hematuria, flank pain, decreased urine volume, difficulty urinating and pelvic pain.  Musculoskeletal: Negative for back pain and joint swelling.  Skin: Negative for rash and wound.  Neurological: Positive for tremors and weakness. Negative for dizziness, seizures, syncope, facial asymmetry, speech difficulty, light-headedness, numbness and headaches.  Hematological: Negative for adenopathy.  Does not bruise/bleed easily.  Psychiatric/Behavioral: Negative for confusion and decreased concentration.    Allergies  Review of patient's allergies indicates no known allergies.  Home Medications  No current outpatient prescriptions on file.  BP 111/72  Pulse 67  Temp 100.6 F (38.1 C)  Resp 15  SpO2 95%  Physical Exam  Constitutional: She is oriented to person, place, and time. She appears well-developed and well-nourished. No distress.  HENT:  Head: Normocephalic and atraumatic.  Eyes: Conjunctivae normal are normal. Right eye exhibits no discharge. Left eye exhibits no discharge. No scleral icterus.  Neck: Normal range of motion. Neck supple.  Cardiovascular: Normal rate, regular rhythm, normal heart sounds and intact distal pulses.   No murmur heard. Pulmonary/Chest: Effort normal. No respiratory distress. She has no wheezes. She has rales. She exhibits no tenderness.  Abdominal: Soft. Bowel sounds are normal. She exhibits no distension and no mass. There is no tenderness. There is no rebound and no guarding.  Musculoskeletal: Normal range of motion. She exhibits no edema and no tenderness.  Neurological: She is alert and oriented to person, place, and time.  Skin: Skin is warm and dry. She is not diaphoretic.  Psychiatric: She has a normal mood and affect.    ED Course  Procedures (including critical care time)  Labs Reviewed  CBC - Abnormal; Notable for the following:    WBC 12.4 (*)     RBC 3.84 (*)     All other components within normal limits  BASIC METABOLIC PANEL - Abnormal; Notable for the following:    GFR calc non  Af Amer 85 (*)     All other components within normal limits  URINALYSIS, ROUTINE W REFLEX MICROSCOPIC - Abnormal; Notable for the following:    Hgb urine dipstick MODERATE (*)     All other components within normal limits  HEPATIC FUNCTION PANEL - Abnormal; Notable for the following:    Total Bilirubin 0.2 (*)     All other components  within normal limits  POCT I-STAT TROPONIN I - Abnormal; Notable for the following:    Troponin i, poc 0.11 (*)     All other components within normal limits  PRO B NATRIURETIC PEPTIDE - Abnormal; Notable for the following:    Pro B Natriuretic peptide (BNP) 625.6 (*)     All other components within normal limits  URINE MICROSCOPIC-ADD ON - Abnormal; Notable for the following:    Squamous Epithelial / LPF FEW (*)     All other components within normal limits  POCT I-STAT TROPONIN I - Abnormal; Notable for the following:    Troponin i, poc 0.09 (*)     All other components within normal limits  TROPONIN I - Abnormal; Notable for the following:    Troponin I 0.35 (*)     All other components within normal limits  CG4 I-STAT (LACTIC ACID)  CULTURE, BLOOD (ROUTINE X 2)  CULTURE, BLOOD (ROUTINE X 2)  CBC  BASIC METABOLIC PANEL  TROPONIN I  TROPONIN I  CULTURE, EXPECTORATED SPUTUM-ASSESSMENT  INFLUENZA PANEL BY PCR  HEMOGLOBIN A1C   Dg Chest 2 View  01/03/2013  *RADIOLOGY REPORT*  Clinical Data: Weakness.  Tremors  CHEST - 2 VIEW  Comparison: 12/16/2009  Findings: Normal heart size.  No pleural effusion or edema.  Plate- like atelectasis is noted within both lung bases.  No airspace consolidation noted.  Lung volumes appear normal.  IMPRESSION:  1.  No acute cardiopulmonary abnormalities. 2.  Bibasilar atelectasis.   Original Report Authenticated By: Signa Kell, M.D.      1. Elevated troponin   2. Chronic airway obstruction, not elsewhere classified   3. Fever   4. Unspecified essential hypertension      EKG reviewed and interpreted. Normal sinus rhythm rate 71. Normal axis. Normal intervals. No T wave changes. No ST segment changes. Normal QT interval. MDM  She is able. 67 year old female presenting with 12 hours of generalized weakness and tremors more actually defined as chills. Temperature 100.29F here. No focal weakness. The patient's denies any chest pain, shortness of  breath, palpitations, or other cardiac symptoms she does have a mildly elevated troponin but a normal EKG. Admitted for further workup.        Consuello Masse, MD 01/04/13 [phone]

## 2013-01-03 NOTE — H&P (Signed)
[facility] History and Physical  Jenna Stanley EAV:[id number] DOB: [birth year] DOA: 01/03/2013  Referring physician: ED PCP: Gwynneth Aliment, MD  Specialists: None  Chief Complaint: Weakness, tremors  HPI: Jenna Stanley is a 67 y.o. female who presents with 1 day history of generalized weakness and tremors / chills.  There is no associated chest pain, shortness of breath, pulmonary symptoms (cough, etc), N/V/D, abdominal pain, dysuria, DOE, orthopnea, or really any other localizing symptom.  The patient has not had a flu shot yet this year.  In the ED, patient was noted to be febrile to 100.6, her O2 sat on room air was 89%, more concerning to the ED however her troponin was noted to be "elevated" at 0.11, repeat was 0.09.  The ED has asked that she be admitted for observation.  Review of Systems: As above, 12 systems reviewed and otherwise negative.  Past Medical History  Diagnosis Date  . Back pain   . COPD (chronic obstructive pulmonary disease)   . Bronchitis    History reviewed. No pertinent past surgical history. Social History:  reports that she has been smoking.  She does not have any smokeless tobacco history on file. She reports that she does not drink alcohol or use illicit drugs.   No Known Allergies  History reviewed. No pertinent family history.   Prior to Admission medications   Medication Sig Start Date End Date Taking? Authorizing Provider  citalopram (CELEXA) 20 MG tablet Take 20 mg by mouth daily.   Yes Historical Provider, MD  gabapentin (NEURONTIN) 300 MG capsule Take 300 mg by mouth at bedtime.   Yes Historical Provider, MD  morphine (MS CONTIN) 100 MG 12 hr tablet Take 100 mg by mouth every 12 (twelve) hours.   Yes Historical Provider, MD  rOPINIRole (REQUIP) 2 MG tablet Take 2 mg by mouth at bedtime.   Yes Historical Provider, MD  tiotropium (SPIRIVA) 18 MCG inhalation capsule Place 18 mcg into inhaler and inhale daily.   Yes Historical Provider, MD    Physical Exam: Filed Vitals:   01/03/13 1900 01/03/13 1930 01/03/13 1942 01/03/13 2144  BP:  132/86 141/82 111/62  Pulse: 64 63 84 63  Temp: 99.4 F (37.4 C)  98.4 F (36.9 C) 99 F (37.2 C)  TempSrc: Oral  Oral Oral  Resp: 20 16  18   Height:    5\' 8"  (1.727 m)  Weight:    71.3 kg (157 lb 3 oz)  SpO2: 96% 95%  100%    General:  NAD, resting comfortably in bed Eyes: PEERLA EOMI ENT: mucous membranes moist Neck: supple w/o JVD Cardiovascular: RRR w/o MRG Respiratory: CTA B Abdomen: soft, nt, nd, bs+ Skin: no rash nor lesion Musculoskeletal: MAE, full ROM all 4 extremities Psychiatric: normal tone and affect Neurologic: AAOx3, grossly non-focal  Labs on Admission:  Basic Metabolic Panel:  Lab 01/03/13 7829  NA 138  K 4.6  CL 104  CO2 26  GLUCOSE 96  BUN 18  CREATININE 0.78  CALCIUM 9.2  MG --  PHOS --   Liver Function Tests:  Lab 01/03/13 1830  AST 17  ALT 16  ALKPHOS 79  BILITOT 0.2*  PROT 7.1  ALBUMIN 3.5   No results found for this basename: LIPASE:5,AMYLASE:5 in the last 168 hours No results found for this basename: AMMONIA:5 in the last 168 hours CBC:  Lab 01/03/13 1830  WBC 12.4*  NEUTROABS --  HGB 12.3  HCT 38.0  MCV 99.0  PLT  303   Cardiac Enzymes: No results found for this basename: CKTOTAL:5,CKMB:5,CKMBINDEX:5,TROPONINI:5 in the last 168 hours  BNP (last 3 results)  Basename 01/03/13 1830  PROBNP 625.6*   CBG: No results found for this basename: GLUCAP:5 in the last 168 hours  Radiological Exams on Admission: Dg Chest 2 View  01/03/2013  *RADIOLOGY REPORT*  Clinical Data: Weakness.  Tremors  CHEST - 2 VIEW  Comparison: 12/16/2009  Findings: Normal heart size.  No pleural effusion or edema.  Plate- like atelectasis is noted within both lung bases.  No airspace consolidation noted.  Lung volumes appear normal.  IMPRESSION:  1.  No acute cardiopulmonary abnormalities. 2.  Bibasilar atelectasis.   Original Report Authenticated By:  Signa Kell, M.D.     EKG: Independently reviewed.  Assessment/Plan Principal Problem:  *Elevated troponin Active Problems:  HYPERTENSION  COPD  Fever   1. Fever - unclear cause, probably viral, checking influenza by PCR but given that patient isnt really showing pulmonary effects of influenza dont feel that starting empiric tamiflu appropriate at this time since if it is flu it is a very mild case.  Likewise will await blood cultures but without a clear source of bacterial infection no reason to treat with ABx at this point. 2. COPD - O2 sat 89% on RA in ED but given lack of pulmonary findings / complaints not convinced that this isnt necessarily baseline for the patient, alternatively the "atelectasis" findings on CXR could represent very mild / viral PNA in this patient but again not very impressive at this point. 3. HTN - continue home meds 4. Elevated troponin - will continue to trend while here but given lack of cardiac complaints and very mild elevation (0.11 is below where or usual cut off was just last month) question the significance of this if any.  No consults obtained at this time.  Code Status: Full Code (must indicate code status--if unknown or must be presumed, indicate so) Family Communication: Spoke with family in room (indicate person spoken with, if applicable, with phone number if by telephone) Disposition Plan: Admit to obs (indicate anticipated LOS)  Time spent: 50 min  Shelva Hetzer M. [facility] Pager [phone]  If 7PM-7AM, please contact night-coverage [email or web address] Password [email or web address] 01/03/2013, 10:12 PM

## 2013-01-04 ENCOUNTER — Encounter (HOSPITAL_COMMUNITY): Payer: Self-pay | Admitting: Physician Assistant

## 2013-01-04 DIAGNOSIS — R0609 Other forms of dyspnea: Secondary | ICD-10-CM

## 2013-01-04 LAB — CBC
HCT: 35.6 % — ABNORMAL LOW (ref 36.0–46.0)
Hemoglobin: 11.5 g/dL — ABNORMAL LOW (ref 12.0–15.0)
MCH: 32 pg (ref 26.0–34.0)
MCV: 99.2 fL (ref 78.0–100.0)
RBC: 3.59 MIL/uL — ABNORMAL LOW (ref 3.87–5.11)

## 2013-01-04 LAB — BASIC METABOLIC PANEL
BUN: 18 mg/dL (ref 6–23)
CO2: 26 mEq/L (ref 19–32)
Calcium: 9.4 mg/dL (ref 8.4–10.5)
Creatinine, Ser: 0.78 mg/dL (ref 0.50–1.10)
Glucose, Bld: 99 mg/dL (ref 70–99)

## 2013-01-04 LAB — INFLUENZA PANEL BY PCR (TYPE A & B)
H1N1 flu by pcr: NOT DETECTED
Influenza B By PCR: NEGATIVE

## 2013-01-04 LAB — HEMOGLOBIN A1C: Mean Plasma Glucose: 120 mg/dL — ABNORMAL HIGH (ref <117)

## 2013-01-04 MED ORDER — BIOTENE DRY MOUTH MT LIQD: 15.0000 mL | Freq: Two times a day (BID) | OROMUCOSAL | Status: DC

## 2013-01-04 NOTE — Progress Notes (Signed)
Merdis Delay NP returned call for elevated Troponin. Pt has no complains of pain at this time. Advised to continue to monitor and notified MD of any changes. Will continue to monitor.

## 2013-01-04 NOTE — Progress Notes (Signed)
[facility]             Progress Note   Subjective: No complaints this am.  Objective: Vital signs in last 24 hours: Temp:  [98.2 F (36.8 C)-100.6 F (38.1 C)] 98.2 F (36.8 C) (01/07 0452) Pulse Rate:  [63-84] 65  (01/07 0452) Resp:  [6-20] 18  (01/07 0452) BP: (97-141)/(55-86) 130/83 mmHg (01/07 0452) SpO2:  [88 %-100 %] 99 % (01/07 0452) FiO2 (%):  [28 %] 28 % (01/06 2211) Weight:  [71.3 kg (157 lb 3 oz)] 71.3 kg (157 lb 3 oz) (01/06 2144) Weight change:  Last BM Date: 01/02/13  Intake/Output from previous day:       Physical Exam: General: Alert, awake, oriented x3, in no acute distress. HEENT: No bruits, no goiter. Heart: Regular rate and rhythm, without murmurs, rubs, gallops. Lungs: Clear to auscultation bilaterally. Abdomen: Soft, nontender, nondistended, positive bowel sounds. Extremities: No clubbing cyanosis or edema with positive pedal pulses. Neuro: Grossly intact, nonfocal.    Lab Results: Basic Metabolic Panel:  Basename 01/04/13 0545 01/03/13 1830  NA 140 138  K 4.8 4.6  CL 105 104  CO2 26 26  GLUCOSE 99 96  BUN 18 18  CREATININE 0.78 0.78  CALCIUM 9.4 9.2  MG -- --  PHOS -- --   Liver Function Tests:  [facility] 01/03/13 1830  AST 17  ALT 16  ALKPHOS 79  BILITOT 0.2*  PROT 7.1  ALBUMIN 3.5   CBC:  Basename 01/04/13 0545 01/03/13 1830  WBC 10.8* 12.4*  NEUTROABS -- --  HGB 11.5* 12.3  HCT 35.6* 38.0  MCV 99.2 99.0  PLT 285 303   Cardiac Enzymes:  Basename 01/04/13 0545 01/03/13 2349  CKTOTAL -- --  CKMB -- --  CKMBINDEX -- --  TROPONINI <0.30 0.35*   BNP:  Basename 01/03/13 1830  PROBNP 625.6*   Urine Drug Screen: Drugs of Abuse     Component Value Date/Time   LABOPIA NONE DETECTED 12/16/2009 2135   COCAINSCRNUR NONE DETECTED 12/16/2009 2135   LABBENZ NONE DETECTED 12/16/2009 2135   AMPHETMU NONE DETECTED 12/16/2009 2135   THCU NONE DETECTED 12/16/2009 2135   LABBARB  Value: NONE DETECTED         DRUG SCREEN FOR MEDICAL PURPOSES ONLY.  IF CONFIRMATION IS NEEDED FOR ANY PURPOSE, NOTIFY LAB WITHIN 5 DAYS.        LOWEST DETECTABLE LIMITS FOR URINE DRUG SCREEN Drug Class       Cutoff (ng/mL) Amphetamine      1000 Barbiturate      200 Benzodiazepine   200 Tricyclics       300 Opiates          300 Cocaine          300 THC              50 12/16/2009 2135    Urinalysis:  Basename 01/03/13 1942  COLORURINE YELLOW  LABSPEC 1.030  PHURINE 5.0  GLUCOSEU NEGATIVE  HGBUR MODERATE*  BILIRUBINUR NEGATIVE  KETONESUR NEGATIVE  PROTEINUR NEGATIVE  UROBILINOGEN 0.2  NITRITE NEGATIVE  LEUKOCYTESUR NEGATIVE    Studies/Results: Dg Chest 2 View  01/03/2013  *RADIOLOGY REPORT*  Clinical Data: Weakness.  Tremors  CHEST - 2 VIEW  Comparison: 12/16/2009  Findings: Normal heart size.  No pleural effusion or edema.  Plate- like atelectasis is noted within both lung bases.  No airspace consolidation noted.  Lung volumes appear normal.  IMPRESSION:  1.  No acute cardiopulmonary  abnormalities. 2.  Bibasilar atelectasis.   Original Report Authenticated By: Signa Kell, M.D.     Medications: Scheduled Meds:   . antiseptic oral rinse  15 mL Mouth Rinse BID  . citalopram  20 mg Oral Daily  . gabapentin  300 mg Oral QHS  . heparin  5,000 Units Subcutaneous Q8H  . morphine  100 mg Oral Q12H  . rOPINIRole  2 mg Oral QHS  . sodium chloride  3 mL Intravenous Q12H  . sodium chloride  3 mL Intravenous Q12H  . tiotropium  18 mcg Inhalation Daily   Continuous Infusions:  PRN Meds:.sodium chloride, albuterol, ipratropium, sodium chloride  Assessment/Plan:  Principal Problem:  *Elevated troponin Active Problems:  HYPERTENSION  COPD  Fever   Fever -Resolved. -Likely a mild viral illness. -Influenza PCR is pending. -Have not started antibiotics or tamiflu.  COPD -Very mild. -Not in acute exacerbation.  Elevated Troponin -With a high of 0.35. -Has not had CP. -I do not have an alternate  explanation for her elevated troponin, and as such will request cardiology's opinion. [facility] cards called.  Disposition -Likely home later today pending cards eval.   Time spent coordinating care: 35 minutes   LOS: 1 day   HERNANDEZ ACOSTA,ESTELA [facility] Pager: [phone] 01/04/2013, 8:19 AM

## 2013-01-04 NOTE — Progress Notes (Signed)
Utilization review completed.  

## 2013-01-04 NOTE — Progress Notes (Signed)
  Echocardiogram 2D Echocardiogram has been performed.  Jenna Stanley 01/04/2013, 1:52 PM

## 2013-01-04 NOTE — Consult Note (Signed)
CARDIOLOGY CONSULT NOTE   Patient ID: Jenna Stanley MRN: [id number] DOB/AGE: 02-17-1946 68 y.o.  Admit date: 01/03/2013  Primary Physician   Jenna Aliment, MD Primary Cardiologist   none Reason for Consultation   elevated troponins  Jenna Stanley is a 66 y.o. female with no history of CAD. She was feeling generally poor for the last few days and was admitted yesterday with low-grade fever, weakness, tremors and chills. She denies cough. She has never had chest pain. She has some chronic dyspnea on exertion but this has not changed recently. She was admitted for possible upper respiratory infection. Her white count was 12.4 but improved to 10.8 overnight. Overnight, she had cardiac enzymes cycled and they showed some elevation, ranging from 0.09 to 0.35 and now back down to his less than 0.30. Cardiology was asked to evaluate her.  Currently, she is afebrile. She has never had chest pain. She denies any history of cough. She is not generally aware of wheezing but her daughter will sometimes tell her she is wheezing. She continues to smoke. She is active at times and was able to walk 1/2 mile a few weeks ago but has chronic dyspnea on exertion and is not able to go up a full flight of steps without stopping. She has had hematuria and was treated for urinary tract infection recently by Jenna Stanley. The hematuria persisted and a renal ultrasound was performed but showed no acute problem. Currently she is asymptomatic and feels her respiratory status is at baseline.   Past Medical History  Diagnosis Date  . Back pain   . COPD (chronic obstructive pulmonary disease)   . Bronchitis      Past Surgical History  Procedure Date  . Excision of right anterior subcutaneous shoulder mass   . Cheilectomy with first mtp joint implant, left foot   . Excision arthritic tibial sesamoid, left foot     No Known Allergies  I have reviewed the patient's current medications    . antiseptic oral  rinse  15 mL Mouth Rinse BID  . citalopram  20 mg Oral Daily  . gabapentin  300 mg Oral QHS  . heparin  5,000 Units Subcutaneous Q8H  . morphine  100 mg Oral Q12H  . rOPINIRole  2 mg Oral QHS  . sodium chloride  3 mL Intravenous Q12H  . sodium chloride  3 mL Intravenous Q12H  . tiotropium  18 mcg Inhalation Daily     sodium chloride, albuterol, ipratropium, sodium chloride  Prior to Admission medications   Medication Sig Start Date End Date Taking? Authorizing Provider  citalopram (CELEXA) 20 MG tablet Take 20 mg by mouth daily.   Yes Historical Provider, MD  gabapentin (NEURONTIN) 300 MG capsule Take 300 mg by mouth at bedtime.   Yes Historical Provider, MD  morphine (MS CONTIN) 100 MG 12 hr tablet Take 100 mg by mouth every 12 (twelve) hours.   Yes Historical Provider, MD  rOPINIRole (REQUIP) 2 MG tablet Take 2 mg by mouth at bedtime.   Yes Historical Provider, MD  tiotropium (SPIRIVA) 18 MCG inhalation capsule Place 18 mcg into inhaler and inhale daily.   Yes Historical Provider, MD     History   Social History  . Marital Status: Married    Spouse Name: N/A    Number of Children: N/A  . Years of Education: N/A   Occupational History  . Not on file.   Social History Main Topics  . Smoking status:  Current Every Day Smoker  . Smokeless tobacco: Not on file  . Alcohol Use: No  . Drug Use: No  . Sexually Active:    Other Topics Concern  . Not on file   Social History Narrative   History of tobacco use, alcohol abuse, heroin, cocaine, and marijuana use. Quit drug use in 1989. Works part-time as [employer]enna [employer].    Family Status  Relation Status Death Age  . Mother Deceased 86s    CVA/Alzheimers  . Father Deceased 60s    CVA/Alzheimers  . Brother Deceased 15    Heart rhythm problems, ETOH  . Brother Other     No CAD      ROS: Chronic back pain, occasional upper respiratory illness, none recently. Full 14 point review of systems complete and found to be negative  unless listed above.  Physical Exam: Blood pressure 130/83, pulse 65, temperature 98.2 F (36.8 C), temperature source Oral, resp. rate 18, height 5\' 8"  (1.727 m), weight 157 lb 3 oz (71.3 kg), SpO2 99.00%.  General: Well developed, well nourished, female in no acute distress Head: Eyes PERRLA, No xanthomas.   Normocephalic and atraumatic, oropharynx without edema or exudate. Dentition: good Lungs: bilateral rales, no crackles or rhonchi Heart: HRRR S1 S2, no rub/gallop, no murmur. pulses are 2+ all 4 extrem.   Neck: No carotid bruits. No lymphadenopathy.  JVD not elevated. Abdomen: Bowel sounds present, abdomen soft and non-tender without masses or hernias noted. Msk:  No spine or cva tenderness. No weakness, no joint deformities or effusions. Extremities: No clubbing or cyanosis. No edema.  Neuro: Alert and oriented X 3. No focal deficits noted. Psych:  Good affect, responds appropriately Skin: No rashes or lesions noted.  Labs:   Lab Results  Component Value Date   WBC 10.8* 01/04/2013   HGB 11.5* 01/04/2013   HCT 35.6* 01/04/2013   MCV 99.2 01/04/2013   PLT 285 01/04/2013   No results found for this basename: INR in the last 72 hours   Lab 01/04/13 0545 01/03/13 1830  NA 140 --  K 4.8 --  CL 105 --  CO2 26 --  BUN 18 --  CREATININE 0.78 --  CALCIUM 9.4 --  PROT -- 7.1  BILITOT -- 0.2*  ALKPHOS -- 79  ALT -- 16  AST -- 17  GLUCOSE 99 --     Basename 01/04/13 0545 01/03/13 2349  CKTOTAL -- --  CKMB -- --  TROPONINI <0.30 0.35*    Basename 01/03/13 2005 01/03/13 1842  TROPIPOC 0.09* 0.11*   Pro B Natriuretic peptide (BNP)  Date/Time Value Range Status  01/03/2013  6:30 PM 625.6* 0 - 125 pg/mL Final  12/17/2009  5:15 AM <30.0  0.0 - 100.0 pg/mL Final   Urinalysis    Component Value Date/Time   COLORURINE YELLOW 01/03/2013 1942   APPEARANCEUR CLEAR 01/03/2013 1942   LABSPEC 1.030 01/03/2013 1942   PHURINE 5.0 01/03/2013 1942   GLUCOSEU NEGATIVE 01/03/2013 1942   HGBUR  MODERATE* 01/03/2013 1942   BILIRUBINUR NEGATIVE 01/03/2013 1942   KETONESUR NEGATIVE 01/03/2013 1942   PROTEINUR NEGATIVE 01/03/2013 1942   UROBILINOGEN 0.2 01/03/2013 1942   NITRITE NEGATIVE 01/03/2013 1942   LEUKOCYTESUR NEGATIVE 01/03/2013 1942   Echo: 12/17/2009 Study Conclusions - Left ventricle: The cavity size was normal. Systolic function was normal. The estimated ejection fraction was in the range of 55% to 60%. Wall motion was normal; there were no regional wall motion abnormalities. - Atrial septum: No defect  or patent foramen ovale was identified.  ECG:  04-Jan-2013 04:58:35 [facility] ROUTINE RECORD Normal sinus rhythm Normal ECG 49mm/s 66mm/mV 100Hz  8.0.1 12SL 239 CID: 21 Referred by: Virginia Rochester Unconfirmed Vent. rate 61 BPM PR interval 170 ms QRS duration 86 ms QT/QTc 426/428 ms P-R-T axes 61 65 52   Radiology:  Dg Chest 2 View 01/03/2013  *RADIOLOGY REPORT*  Clinical Data: Weakness.  Tremors  CHEST - 2 VIEW  Comparison: 12/16/2009  Findings: Normal heart size.  No pleural effusion or edema.  Plate- like atelectasis is noted within both lung bases.  No airspace consolidation noted.  Lung volumes appear normal.  IMPRESSION:  1.  No acute cardiopulmonary abnormalities. 2.  Bibasilar atelectasis.   Original Report Authenticated By: Signa Kell, M.D.    US Renal 12/31/2012  *RADIOLOGY REPORT*  Clinical Data: History of hematuria. Right-sided back pain.  RENAL/URINARY TRACT ULTRASOUND COMPLETE  Comparison:  None.  Findings:  Right Kidney:  Right renal length is 9.8 cm.  Left Kidney:  Left renal length is 9.3 cm.  Examination of each kidney shows no evidence of hydronephrosis, solid or cystic mass, calculus, parenchymal loss, or parenchymal textural abnormality.  Bladder:  No urinary bladder abnormality is evident.  IMPRESSION: No renal abnormalities are evident.   Original Report Authenticated By: Onalee Hua Call     ASSESSMENT AND PLAN:   The patient was seen today  by Dr. Gala Romney, the patient evaluated and the data reviewed.  Principal Problem:  *Elevated troponin - mild crescendo/decrescendo pattern but no ischemic symptoms. We'll check a CK-MB. Will order an echocardiogram (EF previously normal). M.D. advise on further testing. Exertional dyspnea Ongoing tobacco use Otherwise, per primary M.D. Active Problems:  HYPERTENSION  COPD  Fever   Signed: Theodore Demark 01/04/2013, 10:57 AM Co-Sign MD  Patient seen and examined with Theodore Demark, PA-C. We discussed all aspects of the encounter. I agree with the assessment and plan as stated above.   67 y/o woman with h/o ongoing tobacco use and probable COPD. Admitted with weakness and fever. Flu w/u negative. Troponin checked and minimally elevated. Chronic exertional dyspnea but no angina. ECG normal.  We discussed diagnostic possiblities including 1) no further w/u 2) cath 3) echo followed by stress test or cath.  We have decided to proceed with echo. If echo normal can d/c home with outpatient myvoiew. If abnormal will need to reconsider cath. She is OK with this plan.   Counseled on need for smoking cessation.  Daniel Bensimhon,MD 11:29 AM

## 2013-01-04 NOTE — Progress Notes (Signed)
CRITICAL VALUE ALERT  Critical value received: Troponin 0.35  Date of notification:  01/05/2012  Time of notification: 0053   Critical value read back:yes  Nurse who received alert:  Dorna Leitz RN  MD notified (1st page):  Merdis Delay NP  Time of first page:  0056  MD notified (2nd page):  Time of second page:  Responding MD:  Merdis Delay NP  Time MD responded:  [phone]

## 2013-01-05 DIAGNOSIS — R0609 Other forms of dyspnea: Secondary | ICD-10-CM

## 2013-01-05 DIAGNOSIS — F17200 Nicotine dependence, unspecified, uncomplicated: Secondary | ICD-10-CM

## 2013-01-05 NOTE — Discharge Summary (Signed)
Physician Discharge Summary  Jenna Stanley ZOX:[id number] DOB: 10-17-1946 DOA: 01/03/2013  PCP: Gwynneth Aliment, MD  Admit date: 01/03/2013 Discharge date: 01/05/2013  Time spent: 35 minutes  Recommendations for Outpatient Follow-up:  1. Stop smoking  Discharge Diagnoses:  Principal Problem:  *Elevated troponin Active Problems:  HYPERTENSION  COPD  Fever  Dyspnea on exertion   Discharge Condition: improved  Diet recommendation: cardiac  Filed Weights   01/03/13 2144  Weight: 71.3 kg (157 lb 3 oz)    History of present illness:  Jenna Stanley is a 67 y.o. female who presents with 1 day history of generalized weakness and tremors / chills. There is no associated chest pain, shortness of breath, pulmonary symptoms (cough, etc), N/V/D, abdominal pain, dysuria, DOE, orthopnea, or really any other localizing symptom. The patient has not had a flu shot yet this year.  In the ED, patient was noted to be febrile to 100.6, her O2 sat on room air was 89%, more concerning to the ED however her troponin was noted to be "elevated" at 0.11, repeat was 0.09. The ED has asked that she be admitted for observation   Hospital Course:  1. Elevated troponin. Mild. No clinical symptoms. CKMB, Ecg, and Echo all normal. outpatient stress myoview. 2. Tobacco abuse- counseled on smoking cessation.  3. Fever resolved.  4. Mild COPD.   Procedures:   echo Study Conclusions  - Left ventricle: The cavity size was normal. Wall thickness was normal. Systolic function was normal. The estimated ejection fraction was in the range of 55% to 60%. Images were inadequate for LV wall motion assessment. Doppler parameters are consistent with abnormal left ventricular relaxation (grade 1 diastolic dysfunction). The E/e' ratio is <10, suggesting normal LV filling pressure. - Mitral valve: Calcified annulus. Trivial regurgitation. - Left atrium: The atrium was normal in size. - Right atrium: The atrium was at the  upper limits of normal in size. - Atrial septum: No defect or patent foramen ovale was identified.    Consultations:  cardiology  Discharge Exam: Filed Vitals:   01/04/13 1400 01/04/13 2043 01/05/13 0600 01/05/13 0739  BP: 121/76 152/79 131/82   Pulse: 50 60 54   Temp: 98.6 F (37 C) 98.4 F (36.9 C) 98.3 F (36.8 C)   TempSrc:  Oral Oral   Resp: 18 18 18    Height:      Weight:      SpO2: 99% 98% 95% 95%    General: A+Ox3, NAD Cardiovascular: rrr Respiratory: clear anterior  Discharge Instructions      Discharge Orders    Future Appointments: Provider: Department: Dept Phone: Center:   01/06/2013 7:45 AM [facility] SITE 3 NUCLEAR MED [phone] None   01/10/2013 8:50 AM Beatrice Lecher, PA  [facility] Main Office [place]) [phone] [facility]     Future Orders Please Complete By Expires   Diet - low sodium heart healthy      Increase activity slowly      Discharge instructions      Comments:   Outpatient stress test- to be arranged by cardiology       Medication List     As of 01/05/2013 11:45 AM    TAKE these medications         citalopram 20 MG tablet   Commonly known as: CELEXA   Take 20 mg by mouth daily.      gabapentin 300 MG capsule   Commonly known as: NEURONTIN  Take 300 mg by mouth at bedtime.      morphine 100 MG 12 hr tablet   Commonly known as: MS CONTIN   Take 100 mg by mouth every 12 (twelve) hours.      rOPINIRole 2 MG tablet   Commonly known as: REQUIP   Take 2 mg by mouth at bedtime.      tiotropium 18 MCG inhalation capsule   Commonly known as: SPIRIVA   Place 18 mcg into inhaler and inhale daily.        Follow-up Information    Follow up with [facility] On 01/06/2013. (Medical Stress test at 7:45 am, please arrive at 7:30.)    Contact information:   [address] [place] [address]       Follow up with Tereso Newcomer, PA. On 01/10/2013.  (See for Dr Gala Romney at 8:50 am)    Contact information:   [address] [place] [address] [phone]       Follow up with Gwynneth Aliment, MD. In 1 week.   Contact information:   [address] [place] [address] [phone]           The results of significant diagnostics from this hospitalization (including imaging, microbiology, ancillary and laboratory) are listed below for reference.    Significant Diagnostic Studies: Dg Chest 2 View  01/03/2013  *RADIOLOGY REPORT*  Clinical Data: Weakness.  Tremors  CHEST - 2 VIEW  Comparison: 12/16/2009  Findings: Normal heart size.  No pleural effusion or edema.  Plate- like atelectasis is noted within both lung bases.  No airspace consolidation noted.  Lung volumes appear normal.  IMPRESSION:  1.  No acute cardiopulmonary abnormalities. 2.  Bibasilar atelectasis.   Original Report Authenticated By: Signa Kell, M.D.    US Renal  12/31/2012  *RADIOLOGY REPORT*  Clinical Data: History of hematuria. Right-sided back pain.  RENAL/URINARY TRACT ULTRASOUND COMPLETE  Comparison:  None.  Findings:  Right Kidney:  Right renal length is 9.8 cm.  Left Kidney:  Left renal length is 9.3 cm.  Examination of each kidney shows no evidence of hydronephrosis, solid or cystic mass, calculus, parenchymal loss, or parenchymal textural abnormality.  Bladder:  No urinary bladder abnormality is evident.  IMPRESSION: No renal abnormalities are evident.   Original Report Authenticated By: Onalee Hua Call     Microbiology: Recent Results (from the past 240 hour(s))  CULTURE, BLOOD (ROUTINE X 2)     Status: Normal (Preliminary result)   Collection Time   01/03/13  7:15 PM      Component Value Range Status Comment   Specimen Description BLOOD RIGHT WRIST   Final    Special Requests BOTTLES DRAWN AEROBIC ONLY 10CC   Final    Culture  Setup Time 01/04/2013 01:53   Final    Culture     Final    Value:        BLOOD CULTURE RECEIVED NO  GROWTH TO DATE CULTURE WILL BE HELD FOR 5 DAYS BEFORE ISSUING A FINAL NEGATIVE REPORT   Report Status PENDING   Incomplete   CULTURE, BLOOD (ROUTINE X 2)     Status: Normal (Preliminary result)   Collection Time   01/03/13  7:25 PM      Component Value Range Status Comment   Specimen Description BLOOD LEFT ARM   Final    Special Requests BOTTLES DRAWN AEROBIC AND ANAEROBIC 10CC   Final    Culture  Setup Time 01/04/2013 01:53   Final    Culture     Final    Value:        BLOOD CULTURE RECEIVED NO GROWTH TO DATE CULTURE WILL BE HELD FOR 5 DAYS BEFORE ISSUING A FINAL NEGATIVE REPORT   Report Status PENDING   Incomplete      Labs: Basic Metabolic Panel:  Lab 01/04/13 1610 01/03/13 1830  NA 140 138  K 4.8 4.6  CL 105 104  CO2 26 26  GLUCOSE 99 96  BUN 18 18  CREATININE 0.78 0.78  CALCIUM 9.4 9.2  MG -- --  PHOS -- --   Liver Function Tests:  Lab 01/03/13 1830  AST 17  ALT 16  ALKPHOS 79  BILITOT 0.2*  PROT 7.1  ALBUMIN 3.5   No results found for this basename: LIPASE:5,AMYLASE:5 in the last 168 hours No results found for this basename: AMMONIA:5 in the last 168 hours CBC:  Lab 01/04/13 0545 01/03/13 1830  WBC 10.8* 12.4*  NEUTROABS -- --  HGB 11.5* 12.3  HCT 35.6* 38.0  MCV 99.2 99.0  PLT 285 303   Cardiac Enzymes:  Lab 01/04/13 1308 01/04/13 0545 01/03/13 2349  CKTOTAL 159 -- --  CKMB 3.7 -- --  CKMBINDEX -- -- --  TROPONINI <0.30 <0.30 0.35*   BNP: BNP (last 3 results)  Basename 01/03/13 1830  PROBNP 625.6*   CBG: No results found for this basename: GLUCAP:5 in the last 168 hours     Signed:  Benjamine Mola, Shaili Donalson  [facility] 01/05/2013, 11:45 AM

## 2013-01-05 NOTE — Progress Notes (Signed)
TELEMETRY: Reviewed telemetry pt in NSR with sinus bradycardia down into 40s when asleep. No AV block or pauses.: Filed Vitals:   01/04/13 1400 01/04/13 2043 01/05/13 0600 01/05/13 0739  BP: 121/76 152/79 131/82   Pulse: 50 60 54   Temp: 98.6 F (37 C) 98.4 F (36.9 C) 98.3 F (36.8 C)   TempSrc:  Oral Oral   Resp: 18 18 18    Height:      Weight:      SpO2: 99% 98% 95% 95%    Intake/Output Summary (Last 24 hours) at 01/05/13 0824 Last data filed at 01/04/13 2152  Gross per 24 hour  Intake      3 ml  Output      0 ml  Net      3 ml    SUBJECTIVE Feels well. No chest pain. Breathing better. No dizzyness.  LABS: Basic Metabolic Panel:  Basename 01/04/13 0545 01/03/13 1830  NA 140 138  K 4.8 4.6  CL 105 104  CO2 26 26  GLUCOSE 99 96  BUN 18 18  CREATININE 0.78 0.78  CALCIUM 9.4 9.2  MG -- --  PHOS -- --   Liver Function Tests:  [facility] 01/03/13 1830  AST 17  ALT 16  ALKPHOS 79  BILITOT 0.2*  PROT 7.1  ALBUMIN 3.5   No results found for this basename: LIPASE:2,AMYLASE:2 in the last 72 hours CBC:  Basename 01/04/13 0545 01/03/13 1830  WBC 10.8* 12.4*  NEUTROABS -- --  HGB 11.5* 12.3  HCT 35.6* 38.0  MCV 99.2 99.0  PLT 285 303   Cardiac Enzymes:  Basename 01/04/13 1308 01/04/13 0545 01/03/13 2349  CKTOTAL 159 -- --  CKMB 3.7 -- --  CKMBINDEX -- -- --  TROPONINI <0.30 <0.30 0.35*   Hemoglobin A1C:  Basename 01/04/13 0545  HGBA1C 5.8*   Radiology/Studies:  Dg Chest 2 View  01/03/2013  *RADIOLOGY REPORT*  Clinical Data: Weakness.  Tremors  CHEST - 2 VIEW  Comparison: 12/16/2009  Findings: Normal heart size.  No pleural effusion or edema.  Plate- like atelectasis is noted within both lung bases.  No airspace consolidation noted.  Lung volumes appear normal.  IMPRESSION:  1.  No acute cardiopulmonary abnormalities. 2.  Bibasilar atelectasis.   Original Report Authenticated By: Signa Kell, M.D.    US Renal  12/31/2012  *RADIOLOGY REPORT*   Clinical Data: History of hematuria. Right-sided back pain.  RENAL/URINARY TRACT ULTRASOUND COMPLETE  Comparison:  None.  Findings:  Right Kidney:  Right renal length is 9.8 cm.  Left Kidney:  Left renal length is 9.3 cm.  Examination of each kidney shows no evidence of hydronephrosis, solid or cystic mass, calculus, parenchymal loss, or parenchymal textural abnormality.  Bladder:  No urinary bladder abnormality is evident.  IMPRESSION: No renal abnormalities are evident.   Original Report Authenticated By: Onalee Hua Call    Ecg: NSR, normal.  ECHO:Transthoracic Echocardiography  Patient: Jenna Stanley, [place] MR #: [id number] Study Date: 01/04/2013 Gender: F Age: 24 Height: 172.7cm Weight: 71.3kg BSA: 1.19m^2 Pt. Status: Room: 3W12C  PERFORMING Shvc ORDERING Barrett, Rhonda ATTENDING Philip Aspen, Estela ADMITTING Lyda Perone SONOGRAPHER Mathews Argyle, RDCS, ARDMS cc:  ------------------------------------------------------------ LV EF: 55% - 60%  ------------------------------------------------------------ History: PMH: Elevated Troponin Dyspnea. Chronic obstructive pulmonary disease. Risk factors: Current tobacco use. Hypertension.  ------------------------------------------------------------ Study Conclusions  - Left ventricle: The cavity size was normal. Wall thickness was normal. Systolic function was normal. The estimated ejection fraction was in the range of  55% to 60%. Images were inadequate for LV wall motion assessment. Doppler parameters are consistent with abnormal left ventricular relaxation (grade 1 diastolic dysfunction). The E/e' ratio is <10, suggesting normal LV filling pressure. - Mitral valve: Calcified annulus. Trivial regurgitation. - Left atrium: The atrium was normal in size. - Right atrium: The atrium was at the upper limits of normal in size. - Atrial septum: No defect or patent foramen ovale was identified. - Pulmonary arteries: PA peak  pressure: 40mm Hg (S). - Inferior vena cava: The vessel was dilated; the respirophasic diameter changes were blunted (< 50%); findings are consistent with elevated central venous pressure. Transthoracic echocardiography. M-mode, complete 2D, spectral Doppler, and color Doppler. Height: Height: 172.7cm. Height: 68in. Weight: Weight: 71.3kg. Weight: 156.9lb. Body mass index: BMI: 23.9kg/m^2. Body surface area: BSA: 1.53m^2. Blood pressure: 130/83. Patient status: Inpatient. Location: Bedside.  ------------------------------------------------------------  ------------------------------------------------------------ Left ventricle: The cavity size was normal. Wall thickness was normal. Systolic function was normal. The estimated ejection fraction was in the range of 55% to 60%. Images were inadequate for LV wall motion assessment. Doppler parameters are consistent with abnormal left ventricular relaxation (grade 1 diastolic dysfunction). The E/e' ratio is <10, suggesting normal LV filling pressure.  ------------------------------------------------------------ Aortic valve: Structurally normal valve. Cusp separation was normal. Doppler: Transvalvular velocity was within the normal range. There was no stenosis. No regurgitation.  ------------------------------------------------------------ Aorta: The aorta was normal, not dilated, and non-diseased.  ------------------------------------------------------------ Mitral valve: Calcified annulus. Doppler: Trivial regurgitation.  ------------------------------------------------------------ Left atrium: The atrium was normal in size.  ------------------------------------------------------------ Atrial septum: No defect or patent foramen ovale was identified.  ------------------------------------------------------------ Right ventricle: The cavity size was normal. Wall thickness was normal. Systolic function was  normal.  ------------------------------------------------------------ Pulmonic valve: The valve appears to be grossly normal. Doppler: No significant regurgitation.  ------------------------------------------------------------ Tricuspid valve: Poorly visualized. Doppler: No significant regurgitation.  ------------------------------------------------------------ Pulmonary artery: Poorly visualized.  ------------------------------------------------------------ Right atrium: The atrium was at the upper limits of normal in size.  ------------------------------------------------------------ Pericardium: There was no pericardial effusion.  ------------------------------------------------------------ Systemic veins: Inferior vena cava: The vessel was dilated; the respirophasic diameter changes were blunted (< 50%); findings are consistent with elevated central venous pressure.  ------------------------------------------------------------ Post procedure conclusions Ascending Aorta:  - The aorta was normal, not dilated, and non-diseased.  ------------------------------------------------------------  2D measurements Normal Doppler Normal Left ventricle measurements LVID ED, 38.5 mm 43-52 Main pulmonary chord, artery PLAX Pressure, S 40 mm =30 LVID ES, 25.4 mm 23-38 Hg chord, Left ventricle PLAX Ea, lat 8.01 cm/ ------- FS, chord, 34 % >29 ann, tiss s PLAX DP LVPW, ED 9.35 mm ------ E/Ea, lat 8.56 ------- IVS/LVPW 1.35 <1.3 ann, tiss ratio, ED DP Ventricular septum Ea, med 5.81 cm/ ------- IVS, ED 12.6 mm ------ ann, tiss s Aorta DP Root diam, 22 mm ------ E/Ea, med 11.81 ------- ED ann, tiss Left atrium DP AP dim 34 mm ------ Mitral valve AP dim 1.83 cm/m^2 <2.2 Peak E vel 68.6 cm/ ------- index s Peak A vel 87.9 cm/ ------- s Deceleratio 232 ms 150-230 n time Peak E/A 0.8 ------- ratio Tricuspid valve Regurg peak 225 cm/ ------- vel s Peak RV-RA 20 mm  ------- gradient, S Hg Right ventricle Ea vel, lat 12.9 cm/ ------- ann, tiss s DP  ------------------------------------------------------------ Prepared and Electronically Authenticated by  Rennis Golden, [place] 2014-01-07T17:26:11.857   PHYSICAL EXAM General: Well developed, well nourished, in no acute distress. Head: Normocephalic, atraumatic, sclera non-icteric, no xanthomas, nares are without discharge. Neck: Negative for carotid bruits.  JVD not elevated. Lungs: Clear bilaterally to auscultation without wheezes, rales, or rhonchi. Breathing is unlabored. Heart: RRR S1 S2 without murmurs, rubs, or gallops.  Abdomen: Soft, non-tender, non-distended with normoactive bowel sounds. No hepatomegaly. No rebound/guarding. No obvious abdominal masses. Msk:  Strength and tone appears normal for age. Extremities: No clubbing, cyanosis or edema.  Distal pedal pulses are 2+ and equal bilaterally. Neuro: Alert and oriented X 3. Moves all extremities spontaneously. Psych:  Responds to questions appropriately with a normal affect.  ASSESSMENT AND PLAN: 1. Elevated troponin. Mild. No clinical symptoms. CKMB, Ecg, and Echo all normal. Recommend outpatient stress myoview. We will arrange.  2. Tobacco abuse- counseled on smoking cessation.  3. Fever resolved.  4. Mild COPD.  Principal Problem:  *Elevated troponin Active Problems:  HYPERTENSION  COPD  Fever  Dyspnea on exertion    Signed, Diondre Pulis [place] MD,FACC 01/05/2013 8:29 AM

## 2013-01-06 ENCOUNTER — Ambulatory Visit (HOSPITAL_COMMUNITY): Payer: Medicare Other | Attending: Cardiology | Admitting: Radiology

## 2013-01-06 VITALS — BP 142/85 | HR 47 | Ht 68.0 in | Wt 155.0 lb

## 2013-01-06 DIAGNOSIS — I10 Essential (primary) hypertension: Secondary | ICD-10-CM | POA: Insufficient documentation

## 2013-01-06 DIAGNOSIS — R0602 Shortness of breath: Secondary | ICD-10-CM

## 2013-01-06 DIAGNOSIS — F17200 Nicotine dependence, unspecified, uncomplicated: Secondary | ICD-10-CM | POA: Insufficient documentation

## 2013-01-06 DIAGNOSIS — R0989 Other specified symptoms and signs involving the circulatory and respiratory systems: Secondary | ICD-10-CM | POA: Insufficient documentation

## 2013-01-06 DIAGNOSIS — R0609 Other forms of dyspnea: Secondary | ICD-10-CM | POA: Insufficient documentation

## 2013-01-06 DIAGNOSIS — R42 Dizziness and giddiness: Secondary | ICD-10-CM | POA: Insufficient documentation

## 2013-01-06 MED ORDER — TECHNETIUM TC 99M SESTAMIBI GENERIC - CARDIOLITE
33.0000 | Freq: Once | INTRAVENOUS | Status: AC | PRN
  Administered 2013-01-06: 33 via INTRAVENOUS

## 2013-01-06 MED ORDER — TECHNETIUM TC 99M SESTAMIBI GENERIC - CARDIOLITE
11.0000 | Freq: Once | INTRAVENOUS | Status: AC | PRN
  Administered 2013-01-06: 11 via INTRAVENOUS

## 2013-01-06 MED ORDER — REGADENOSON 0.4 MG/5ML IV SOLN
0.4000 mg | Freq: Once | INTRAVENOUS | Status: AC
  Administered 2013-01-06: 0.4 mg via INTRAVENOUS

## 2013-01-06 NOTE — Progress Notes (Signed)
[facility] SITE 3 NUCLEAR MED [address] [place], [place] [address] [phone]    Cardiology Nuclear Med Study  Jenna Stanley is a 67 y.o. female     MRN : [id number]     DOB: 1946/06/26  Procedure Date: 01/06/2013  Nuclear Med Background Indication for Stress Test:  Evaluation for Ischemia and Post Hospital on 01/03/13 for chronic exertional dyspnea with elevated troponin's. History:  01/04/13 Echo:EF=60% Cardiac Risk Factors: Hypertension and Smoker  Symptoms:  Dizziness, DOE, Fatigue and Rapid HR   Nuclear Pre-Procedure Caffeine/Decaff Intake:  None > 12 hrs NPO After: 7:30pm   Lungs:  Clear. O2 Sat: 98% on room air. IV 0.9% NS with Angio Cath:  24g  IV Site: R Hand, tolerated well IV Started by:  Irean Hong, RN  Chest Size (in):  36 Cup Size: B  Height: 5\' 8"  (1.727 m)  Weight:  155 lb (70.308 kg)  BMI:  Body mass index is 23.57 kg/(m^2). Tech Comments:  n/a    Nuclear Med Study 1 or 2 day study: 1 day  Stress Test Type:  Treadmill/Lexiscan  Reading MD: Olga Millers, MD  Order Authorizing Provider:  Arvilla Meres, MD  Resting Radionuclide: Technetium 17m Sestamibi  Resting Radionuclide Dose: 10.8 mCi   Stress Radionuclide:  Technetium 49m Sestamibi  Stress Radionuclide Dose: 33.0 mCi           Stress Protocol Rest HR: 47 Stress HR: 92  Rest BP: 142/85 Stress BP: 199/82  Exercise Time (min): 2:00 METS: n/a   Predicted Max HR: 153 bpm % Max HR: 60.13 bpm Rate Pressure Product: 21308    Dose of Adenosine (mg):  n/a Dose of Lexiscan: 0.4 mg  Dose of Atropine (mg): n/a Dose of Dobutamine: n/a mcg/kg/min (at max HR)  Stress Test Technologist: Smiley Houseman, CMA-N  Nuclear Technologist:  Domenic Polite, CNMT     Rest Procedure:  Myocardial perfusion imaging was performed at rest 45 minutes following the intravenous administration of Technetium 14m Sestamibi.  Rest ECG: Sinus bradycardia, early repolarization abnormality.  Stress Procedure:   The patient received IV Lexiscan 0.4 mg over 15-seconds with concurrent low level exercise and then Technetium 25m Sestamibi was injected at 30-seconds while the patient continued walking one more minute.  She only c/o a little shortness of breath with Lexiscan.  Quantitative spect images were obtained after a 45-minute delay.  Stress ECG: No significant ST segment change suggestive of ischemia.  QPS Raw Data Images:  Acquisition technically good; normal left ventricular size. Stress Images:  Normal homogeneous uptake in all areas of the myocardium. Rest Images:  Normal homogeneous uptake in all areas of the myocardium. Subtraction (SDS):  No evidence of ischemia. Transient Ischemic Dilatation (Normal <1.22):  1.05 Lung/Heart Ratio (Normal <0.45):  0.34  Quantitative Gated Spect Images QGS EDV:  86 ml QGS ESV:  28 ml  Impression Exercise Capacity:  Lexiscan with low level exercise. BP Response:  Normal blood pressure response. Clinical Symptoms:  There is dyspnea. ECG Impression:  No significant ST segment change suggestive of ischemia. Comparison with Prior Nuclear Study: No previous nuclear study performed  Overall Impression:  Normal stress nuclear study.  LV Ejection Fraction: 68%.  LV Wall Motion:  NL LV Function; NL Wall Motion  Olga Millers

## 2013-01-09 NOTE — ED Provider Notes (Signed)
I saw and evaluated the patient, reviewed the resident's note and I agree with the findings and plan.   Timiyah Romito T Shateka Petrea, MD 01/09/13 2138 

## 2013-01-10 ENCOUNTER — Ambulatory Visit (INDEPENDENT_AMBULATORY_CARE_PROVIDER_SITE_OTHER): Payer: Medicare Other | Admitting: Physician Assistant

## 2013-01-10 ENCOUNTER — Encounter: Payer: Self-pay | Admitting: Physician Assistant

## 2013-01-10 ENCOUNTER — Telehealth: Payer: Self-pay | Admitting: *Deleted

## 2013-01-10 VITALS — BP 140/82 | HR 56 | Ht 68.0 in | Wt 156.0 lb

## 2013-01-10 DIAGNOSIS — R7989 Other specified abnormal findings of blood chemistry: Secondary | ICD-10-CM

## 2013-01-10 DIAGNOSIS — I10 Essential (primary) hypertension: Secondary | ICD-10-CM

## 2013-01-10 DIAGNOSIS — J449 Chronic obstructive pulmonary disease, unspecified: Secondary | ICD-10-CM

## 2013-01-10 LAB — CULTURE, BLOOD (ROUTINE X 2): Culture: NO GROWTH

## 2013-01-10 MED ORDER — ASPIRIN EC 81 MG PO TBEC: 81.0000 mg | DELAYED_RELEASE_TABLET | Freq: Every day | ORAL | Status: DC

## 2013-01-10 NOTE — Patient Instructions (Addendum)
Your physician recommends that you schedule a follow-up appointment in: 02/21/13 8:50 WITH SCOTT WEAVER, PAC  NO CHANGES WERE MADE TODAY

## 2013-01-10 NOTE — Progress Notes (Signed)
[address] [place], [place]  [address] Phone: [phone], Fax:  [phone]  Date:  01/10/2013   Name:  Jenna Stanley   DOB:  [birth year]   MRN:  [id number]  PCP:  Gwynneth Aliment, MD  Primary Cardiologist:  Seen by Dr. Arvilla Meres in the hospital Primary Electrophysiologist:  None    History of Present Illness: Jenna Stanley is a 67 y.o. female who returns for follow up after recent admission to the hospital for elevated troponins in the setting of febrile illness.  Patient was admitted 1/651/8. She had been feeling poorly for the last several days and was admitted with a low-grade fever, weakness, tremors and chills. There was no reported history of chest pain. Patient has chronic dyspnea. Troponins returned abnormal ranging from 0.9-0.35 and then back to normal. She had had a recent diagnosis of urinary tract infection that was treated. Prior echocardiogram demonstrated normal ejection fraction. Echocardiogram was arranged. 2-D echo 01/04/13: EF 55-60%, grade 1 diastolic dysfunction, MAC, trivial MR, PASP 40. Patient was discharged home. Outpatient stress test was arranged. Lexiscan Myoview 01/07/13: No ischemia, EF 68%, normal stress nuclear study with normal wall motion.  She still feels weak.  Denies any chest discomfort.  No syncope.  No orthopnea, PND.  Notes some ankle edema.  Has a lot of trouble with memory.  Sees neurologist (Dr. Clarisse Gouge).  She has chronic DOE.  After extensive review of her symptoms, her breathing seems to be stable.  She denies significant worsening.    Labs (1/14):   K 4.8, creatinine 0.78, ALT 16, Tn 0.35 => < 0.30, WBC 12.4 => 10.8, Hgb 11.5  Wt Readings from Last 3 Encounters:  01/10/13 156 lb (70.761 kg)  01/06/13 155 lb (70.308 kg)  01/03/13 157 lb 3 oz (71.3 kg)     Past Medical History  Diagnosis Date  . Back pain   . COPD (chronic obstructive pulmonary disease)   . History of echocardiogram     a.  2-D echo 01/04/13: EF  55-60%, grade 1 diastolic dysfunction, MAC, trivial MR, PASP 40  . Elevated troponin 12/2012    a.  in setting of viral illness (0.35 => < 0.30);   Lexiscan Myoview 01/07/13: No ischemia, EF 68%, normal stress nuclear study with normal wall motion.    Current Outpatient Prescriptions  Medication Sig Dispense Refill  . citalopram (CELEXA) 20 MG tablet Take 20 mg by mouth daily.      Marland Kitchen gabapentin (NEURONTIN) 300 MG capsule Take 300 mg by mouth at bedtime.      Marland Kitchen morphine (MS CONTIN) 100 MG 12 hr tablet Take 100 mg by mouth every 12 (twelve) hours.      Marland Kitchen rOPINIRole (REQUIP) 2 MG tablet Take 2 mg by mouth at bedtime.      Marland Kitchen tiotropium (SPIRIVA) 18 MCG inhalation capsule Place 18 mcg into inhaler and inhale daily.      Marland Kitchen oxyCODONE-acetaminophen (PERCOCET) 7.5-325 MG per tablet         Allergies:   No Known Allergies  Social History:  The patient  reports that she has been smoking.  She does not have any smokeless tobacco history on file. She reports that she does not drink alcohol or use illicit drugs.   ROS:  Please see the history of present illness.      All other systems reviewed and negative.   PHYSICAL EXAM: VS:  BP 140/82  Pulse 56  Ht 5\' 8"  (  1.727 m)  Wt 156 lb (70.761 kg)  BMI 23.72 kg/m2 Well nourished, well developed, in no acute distress HEENT: normal Neck: no JVD Vascular:  No carotid bruits bilaterally Cardiac:  normal S1, S2; RRR; no murmur Lungs:  clear to auscultation bilaterally, no wheezing, rhonchi or rales Abd: soft, nontender, no hepatomegaly Ext: no edema Skin: warm and dry Neuro:  CNs 2-12 intact, no focal abnormalities noted  EKG:  NSR, HR 68, normal axis, nonspecific ST-T wave change     ASSESSMENT AND PLAN:  1. Abnormal Troponins:  Etiology of this is unclear.  She had minimally elevated troponins.  They returned to normal quickly.  She has not had chest pain.  Echo was ok with normal LVF.  Myoview with normal perfusion.  She continues to feel weak.   This is likely deconditioning from her recent illness.  Suspect this was a viral syndrome.  Chronic DOE is unchanged.  She is on Spriva and now Symbicort.  At this point, no further cardiac workup.  Will ask her to take ASA 81 mg QD.  Plan follow up with me in 6-8 weeks.  If she has progressive symptoms, consider further cardiac testing.  2. COPD:  Follow up with PCP as directed for further management.  3. Disposition:  Follow up with me in 6-8 weeks.  Signed, Tereso Newcomer, PA-C  9:05 AM 01/10/2013

## 2013-01-10 NOTE — Telephone Encounter (Signed)
called home # and lvm with daughter for pt to start 71 asa. lmom on cell # to start asa 81 mg daily, tcb and let me know she got the message. I added asa 81 mg qd to med list already.

## 2013-01-11 NOTE — Telephone Encounter (Signed)
lmom x 2 to make sure pt rcv'd call yesterday to start asa 81

## 2013-01-12 ENCOUNTER — Telehealth: Payer: Self-pay | Admitting: Physician Assistant

## 2013-01-12 NOTE — Telephone Encounter (Signed)
New problem:   Returning Jenna Stanley called.

## 2013-01-13 ENCOUNTER — Telehealth: Payer: Self-pay | Admitting: *Deleted

## 2013-01-13 NOTE — Telephone Encounter (Signed)
pt just wanted to tell me she did get my message about starting asa 25

## 2013-01-13 NOTE — Telephone Encounter (Signed)
pt just wanted to tell me she did get my message about starting asa 81 

## 2013-01-13 NOTE — Telephone Encounter (Signed)
Message copied by Tarri Fuller on Thu Jan 13, 2013  5:56 PM ------      Message from: Noralee Space      Created: Thu Jan 13, 2013  5:24 PM                   ----- Message -----         From: Dolores Patty, MD         Sent: 01/08/2013   8:51 PM           To: Noralee Space, RN            Normal Myoview.

## 2013-01-13 NOTE — Telephone Encounter (Signed)
pt notified about normal myoview results with verbal understanding and said she thought that we were so very nice as to how we treated her

## 2013-02-12 ENCOUNTER — Other Ambulatory Visit: Payer: Self-pay

## 2013-02-21 ENCOUNTER — Ambulatory Visit: Payer: Medicare Other | Admitting: Physician Assistant

## 2013-04-15 ENCOUNTER — Encounter: Payer: Self-pay | Admitting: Physician Assistant

## 2013-08-03 ENCOUNTER — Other Ambulatory Visit: Payer: Self-pay

## 2013-11-03 ENCOUNTER — Other Ambulatory Visit: Payer: Self-pay

## 2014-06-06 ENCOUNTER — Encounter (HOSPITAL_COMMUNITY): Payer: Self-pay | Admitting: Emergency Medicine

## 2014-06-06 ENCOUNTER — Emergency Department (HOSPITAL_COMMUNITY)
Admission: EM | Admit: 2014-06-06 | Discharge: 2014-06-06 | Disposition: A | Discharge status: Home / Self Care | Payer: Commercial Managed Care - HMO | Source: Home / Self Care | Attending: Emergency Medicine | Admitting: Emergency Medicine

## 2014-06-06 DIAGNOSIS — K1370 Unspecified lesions of oral mucosa: Secondary | ICD-10-CM

## 2014-06-06 DIAGNOSIS — K121 Other forms of stomatitis: Secondary | ICD-10-CM

## 2014-06-06 MED ORDER — CLINDAMYCIN HCL 300 MG PO CAPS: 300.0000 mg | ORAL_CAPSULE | Freq: Four times a day (QID) | ORAL | Status: DC

## 2014-06-06 MED ORDER — CHLORHEXIDINE GLUCONATE 0.12 % MT SOLN: 15.0000 mL | Freq: Two times a day (BID) | OROMUCOSAL | Status: DC

## 2014-06-06 NOTE — Discharge Instructions (Signed)
Leave denture out.  Eat soft diet.

## 2014-06-06 NOTE — ED Notes (Signed)
Pt c/o poss cellulitis to upper lip/mouth onset Sunday night Sx include swelling, fevers, and tenderness Has applied warm compressions w/no relief Alert w/no signs of acute distress.

## 2014-06-06 NOTE — ED Provider Notes (Signed)
Chief Complaint   Chief Complaint  Patient presents with  . Cellulitis    History of Present Illness   Jenna Stanley is  A 68 year old female who has had a three-day history of a tender area with slight swelling on her right upper lip. It hurts to touch. She's had a temperature of 101 yesterday and headache. She denies any stiff neck. There's no nasal drainage or nasal pain, no eye symptoms, no neck pain or adenopathy. She is edentulous. She denies any sore throat or lesions in the mouth.  Review of Systems   Other than as noted above, the patient denies any of the following symptoms: Systemic:  No fevers or chills. Eye:  No redness, pain, discharge, itching, blurred vision, or diplopia. ENT:  No headache, nasal congestion, sneezing, itching, epistaxis, ear pain, decreased hearing, ringing in ears, vertigo, or tinnitus.  No oral lesions, sore throat, or hoarseness. Neck:  No neck pain or adenopathy. Skin:  No rash or itching.  PMFSH   Past medical history, family history, social history, meds, and allergies were reviewed. She has chronic lower back pain, restless leg syndrome, COPD, and neuropathy. Current meds include Symbicort, Requip, OxyContin, and gabapentin.  Physical Examination     Vital signs:  BP 129/73  Pulse 71  Temp(Src) 99.1 F (37.3 C) (Oral)  Resp 16  SpO2 100% General:  Alert and oriented.  In no distress.  Skin warm and dry. Eye:  PERRL, full EOMs, lids and conjunctiva normal.   ENT:  TMs and canals clear.  Nasal mucosa not congested and without drainage.  Mucous membranes moist, no oral lesions, normal dentition, pharynx clear.  No cranial or facial pain to palplation. She is edentulous and has dentures in place. She was allowed to remove the dentures and there was found to be a small 3 mm ulcerated area in the labial gingival sulcus, just to the right of the frenulum. This appeared fairly deep it was very tender to touch. There is no purulent drainage.  Remainder of the mouth exam was normal. There was no external swelling or erythema, but was tender to touch over her right upper lip. Neck:  Supple, full ROM.  No adenopathy, tenderness or mass.  Thyroid normal. Lungs:  Breath sounds clear and equal bilaterally.  No wheezes, rales or rhonchi. Heart:  Rhythm regular, without extrasystoles.  No gallops or murmers. Skin:  Clear, warm and dry.   Assessment   The encounter diagnosis was Oral ulcer.  She has an oral ulcer, I think this is due to rubbing from her dentures. Will recheck this again in 2 days to assure healing. If nonhealing oral ulceration, will need to followup with ENT.  Plan    1.  Meds:  The following meds were prescribed:   Discharge Medication List as of 06/06/2014  5:38 PM    START taking these medications   Details  chlorhexidine (PERIDEX) 0.12 % solution Use as directed 15 mLs in the mouth or throat 2 (two) times daily., Starting 06/06/2014, Until Discontinued, Normal    clindamycin (CLEOCIN) 300 MG capsule Take 1 capsule (300 mg total) by mouth 4 (four) times daily., Starting 06/06/2014, Until Discontinued, Normal        2.  Patient Education/Counseling:  The patient was given appropriate handouts, self care instructions, and instructed in symptomatic relief.  Patient told to leave her dentures out and to follow a soft diet.  3.  Follow up:  The patient was told to follow  up here in 48 hours, or sooner if becoming worse in any way, and given some red flag symptoms such as increasing fever, headache, stiff neck, or facial swelling which would prompt immediate return.       Reuben Likes, MD 06/06/14 [id number]

## 2014-06-08 ENCOUNTER — Encounter (HOSPITAL_COMMUNITY): Payer: Self-pay | Admitting: Emergency Medicine

## 2014-06-08 ENCOUNTER — Emergency Department (INDEPENDENT_AMBULATORY_CARE_PROVIDER_SITE_OTHER)
Admission: EM | Admit: 2014-06-08 | Discharge: 2014-06-08 | Disposition: A | Discharge status: Home / Self Care | Payer: Commercial Managed Care - HMO | Source: Home / Self Care

## 2014-06-08 DIAGNOSIS — K068 Other specified disorders of gingiva and edentulous alveolar ridge: Secondary | ICD-10-CM

## 2014-06-08 DIAGNOSIS — K1370 Unspecified lesions of oral mucosa: Secondary | ICD-10-CM

## 2014-06-08 DIAGNOSIS — K1379 Other lesions of oral mucosa: Secondary | ICD-10-CM

## 2014-06-08 DIAGNOSIS — K055 Other periodontal diseases: Secondary | ICD-10-CM

## 2014-06-08 NOTE — ED Notes (Signed)
Pt  Seen  2  Days  Ago  ucc    For   Mouth  Infection   Pt  Taking  meds  As   Dit=rected  And  Reports  Doing  Better  She  Thinks

## 2014-06-08 NOTE — Discharge Instructions (Signed)
Continue your medications. Add topical oragel for local pain. For any worsening may return.

## 2014-06-08 NOTE — ED Provider Notes (Signed)
CSN: [id number]     Arrival date & time 06/08/14  1137 History   First MD Initiated Contact with Patient 06/08/14 1153     Chief Complaint  Patient presents with  . Follow-up   (Consider location/radiation/quality/duration/timing/severity/associated sxs/prior Treatment) HPI Comments: Pt st her mouth pain is a little better, no worse or new sx's. St the facial swelling is decreasing.    Past Medical History  Diagnosis Date  . Back pain   . COPD (chronic obstructive pulmonary disease)   . History of echocardiogram     a.  2-D echo 01/04/13: EF 55-60%, grade 1 diastolic dysfunction, MAC, trivial MR, PASP 40  . Elevated troponin 12/2012    a.  in setting of viral illness (0.35 => < 0.30);   Lexiscan Myoview 01/07/13: No ischemia, EF 68%, normal stress nuclear study with normal wall motion.   Past Surgical History  Procedure Laterality Date  . Excision of right anterior subcutaneous shoulder mass    . Cheilectomy with first mtp joint implant, left foot    . Excision arthritic tibial sesamoid, left foot     History reviewed. No pertinent family history. History  Substance Use Topics  . Smoking status: Current Every Day Smoker  . Smokeless tobacco: Not on file  . Alcohol Use: No   OB History   Grav Para Term Preterm Abortions TAB SAB Ect Mult Living                 Review of Systems  Constitutional: Negative.   HENT: Positive for facial swelling. Negative for dental problem, ear discharge and sore throat.   Respiratory: Negative.   Gastrointestinal: Negative.     Allergies  Review of patient's allergies indicates no known allergies.  Home Medications   Prior to Admission medications   Medication Sig Start Date End Date Taking? Authorizing Provider  aspirin EC 81 MG tablet Take 1 tablet (81 mg total) by mouth daily. 01/10/13   Beatrice LecherScott T Weaver, PA-C  chlorhexidine (PERIDEX) 0.12 % solution Use as directed 15 mLs in the mouth or throat 2 (two) times daily. 06/06/14   Reuben Likesavid C  Keller, MD  citalopram (CELEXA) 20 MG tablet Take 20 mg by mouth daily.    Historical Provider, MD  clindamycin (CLEOCIN) 300 MG capsule Take 1 capsule (300 mg total) by mouth 4 (four) times daily. 06/06/14   Reuben Likesavid C Keller, MD  gabapentin (NEURONTIN) 300 MG capsule Take 300 mg by mouth at bedtime.    Historical Provider, MD  morphine (MS CONTIN) 100 MG 12 hr tablet Take 100 mg by mouth every 12 (twelve) hours.    Historical Provider, MD  oxyCODONE-acetaminophen (PERCOCET) 7.5-325 MG per tablet  12/29/12   Historical Provider, MD  rOPINIRole (REQUIP) 2 MG tablet Take 2 mg by mouth at bedtime.    Historical Provider, MD  tiotropium (SPIRIVA) 18 MCG inhalation capsule Place 18 mcg into inhaler and inhale daily.    Historical Provider, MD   BP 99/64  Pulse 85  Temp(Src) 98.2 F (36.8 C) (Oral)  Resp 16  SpO2 90% Physical Exam  Nursing note and vitals reviewed. Constitutional: She is oriented to person, place, and time. She appears well-developed and well-nourished. No distress.  HENT:  Mouth/Throat: No oropharyngeal exudate.  OP is clear There is an ulcer type lesion to the upper anterior gum  At the midline. Approx 4-5 mm. No drainage or bleeding. Per pt has not changed. No surrounding swelling , erythema or other signs of  infection.  Mild left facial puffiness. No tension or discoloration. Nontender.   Eyes: Conjunctivae and EOM are normal.  Neck: Normal range of motion. Neck supple.  Lymphadenopathy:    She has no cervical adenopathy.  Neurological: She is alert and oriented to person, place, and time.  Skin: Skin is warm and dry.  Psychiatric: She has a normal mood and affect.    ED Course  Procedures (including critical care time) Labs Review Labs Reviewed - No data to display  Imaging Review No results found.   MDM   1. Gingival ulcer   2. Mouth pain    Cont meds Add oragel prn Return if worse or new signs/problems.    Hayden Rasmussen, NP 06/08/14 1249

## 2014-06-14 NOTE — ED Provider Notes (Signed)
Medical screening examination/treatment/procedure(s) were performed by a resident physician or non-physician practitioner and as the supervising physician I was immediately available for consultation/collaboration.  Janani Chamber, MD    Krisa Blattner S Nathanyel Defenbaugh, MD 06/14/14 0737 

## 2015-01-08 DIAGNOSIS — I10 Essential (primary) hypertension: Secondary | ICD-10-CM | POA: Diagnosis not present

## 2015-01-08 DIAGNOSIS — J449 Chronic obstructive pulmonary disease, unspecified: Secondary | ICD-10-CM | POA: Diagnosis not present

## 2015-01-08 DIAGNOSIS — M2550 Pain in unspecified joint: Secondary | ICD-10-CM | POA: Diagnosis not present

## 2015-01-08 DIAGNOSIS — Z79899 Other long term (current) drug therapy: Secondary | ICD-10-CM | POA: Diagnosis not present

## 2015-02-01 DIAGNOSIS — M545 Low back pain: Secondary | ICD-10-CM | POA: Diagnosis not present

## 2015-02-01 DIAGNOSIS — Z720 Tobacco use: Secondary | ICD-10-CM | POA: Diagnosis not present

## 2015-02-01 DIAGNOSIS — M1612 Unilateral primary osteoarthritis, left hip: Secondary | ICD-10-CM | POA: Diagnosis not present

## 2015-02-18 ENCOUNTER — Emergency Department (HOSPITAL_COMMUNITY)
Admission: EM | Admit: 2015-02-18 | Discharge: 2015-02-18 | Disposition: A | Discharge status: Home / Self Care | Payer: Commercial Managed Care - HMO | Attending: Emergency Medicine | Admitting: Emergency Medicine

## 2015-02-18 ENCOUNTER — Encounter (HOSPITAL_COMMUNITY): Payer: Self-pay | Admitting: *Deleted

## 2015-02-18 ENCOUNTER — Emergency Department (HOSPITAL_COMMUNITY): Payer: Commercial Managed Care - HMO

## 2015-02-18 DIAGNOSIS — Z7982 Long term (current) use of aspirin: Secondary | ICD-10-CM | POA: Diagnosis not present

## 2015-02-18 DIAGNOSIS — G2581 Restless legs syndrome: Secondary | ICD-10-CM | POA: Insufficient documentation

## 2015-02-18 DIAGNOSIS — Z720 Tobacco use: Secondary | ICD-10-CM | POA: Diagnosis not present

## 2015-02-18 DIAGNOSIS — M5430 Sciatica, unspecified side: Secondary | ICD-10-CM | POA: Diagnosis not present

## 2015-02-18 DIAGNOSIS — M1611 Unilateral primary osteoarthritis, right hip: Secondary | ICD-10-CM | POA: Insufficient documentation

## 2015-02-18 DIAGNOSIS — M160 Bilateral primary osteoarthritis of hip: Secondary | ICD-10-CM | POA: Diagnosis not present

## 2015-02-18 DIAGNOSIS — M5136 Other intervertebral disc degeneration, lumbar region: Secondary | ICD-10-CM | POA: Diagnosis not present

## 2015-02-18 DIAGNOSIS — Z7951 Long term (current) use of inhaled steroids: Secondary | ICD-10-CM | POA: Insufficient documentation

## 2015-02-18 DIAGNOSIS — Z792 Long term (current) use of antibiotics: Secondary | ICD-10-CM | POA: Diagnosis not present

## 2015-02-18 DIAGNOSIS — M25551 Pain in right hip: Secondary | ICD-10-CM

## 2015-02-18 DIAGNOSIS — R200 Anesthesia of skin: Secondary | ICD-10-CM | POA: Insufficient documentation

## 2015-02-18 DIAGNOSIS — J449 Chronic obstructive pulmonary disease, unspecified: Secondary | ICD-10-CM | POA: Diagnosis not present

## 2015-02-18 DIAGNOSIS — R531 Weakness: Secondary | ICD-10-CM | POA: Diagnosis not present

## 2015-02-18 DIAGNOSIS — Z79899 Other long term (current) drug therapy: Secondary | ICD-10-CM | POA: Diagnosis not present

## 2015-02-18 DIAGNOSIS — Z79891 Long term (current) use of opiate analgesic: Secondary | ICD-10-CM | POA: Insufficient documentation

## 2015-02-18 DIAGNOSIS — G8929 Other chronic pain: Secondary | ICD-10-CM | POA: Diagnosis not present

## 2015-02-18 DIAGNOSIS — M4186 Other forms of scoliosis, lumbar region: Secondary | ICD-10-CM | POA: Diagnosis not present

## 2015-02-18 HISTORY — DX: Sciatica, unspecified side: M54.30

## 2015-02-18 HISTORY — DX: Restless legs syndrome: G25.81

## 2015-02-18 HISTORY — DX: Pain, unspecified: R52

## 2015-02-18 LAB — I-STAT CHEM 8, ED
BUN: 16 mg/dL (ref 6–23)
CHLORIDE: 104 mmol/L (ref 96–112)
Calcium, Ion: 1.25 mmol/L (ref 1.13–1.30)
Creatinine, Ser: 0.9 mg/dL (ref 0.50–1.10)
Glucose, Bld: 83 mg/dL (ref 70–99)
HEMATOCRIT: 44 % (ref 36.0–46.0)
Hemoglobin: 15 g/dL (ref 12.0–15.0)
Potassium: 4.3 mmol/L (ref 3.5–5.1)
SODIUM: 140 mmol/L (ref 135–145)
TCO2: 24 mmol/L (ref 0–100)

## 2015-02-18 MED ORDER — FENTANYL CITRATE 0.05 MG/ML IJ SOLN: 100.0000 ug | Freq: Once | INTRAMUSCULAR | Status: DC

## 2015-02-18 MED ORDER — FENTANYL CITRATE 0.05 MG/ML IJ SOLN
50.0000 ug | Freq: Once | INTRAMUSCULAR | Status: AC
  Administered 2015-02-18: 50 ug via INTRAVENOUS
  Filled 2015-02-18: qty 2

## 2015-02-18 NOTE — ED Notes (Signed)
Pt reports right hip pain, leg pain x 2 week, hx of chronic back pain and left hip pain. States that right lower leg also feels numb.

## 2015-02-18 NOTE — ED Notes (Signed)
Attempted IV access.  

## 2015-02-18 NOTE — ED Provider Notes (Signed)
CSN: [id number]     Arrival date & time 02/18/15  1209 History   First MD Initiated Contact with Patient 02/18/15 1231     Chief Complaint  Patient presents with  . Hip Pain  . Leg Pain     (Consider location/radiation/quality/duration/timing/severity/associated sxs/prior Treatment) The history is provided by the patient. No language interpreter was used.  Jenna Stanley is a 69 y/o with PMHx of COPD, chronic back pain being followed by Pain Management in [place] presenting to the ED with right hip pain that has been ongoing for the past 2 weeks - 02/02/2015. Patient reported that the pain is a dull, aching, throbbing, sharp, shooting pain with radiation down the right leg. Stated that the pain is constant - worse with apply pressure and laying on the right side. Reported that she has been having weakness in her leg where she is not able to lift the leg, but stated that there is pain but she feels weakness as well. Patient reported that she was seen at pain management regarding her left hip pain-reported that she's had numerous injections in her left hip, last injection was in November 2015. Patient reported that she's been taking Roxicodone for her pain. Denied fall, injury, loss of sensation, changes to skin colored, fever, red streaks, travel, chest pain, short of breath, difficulty breathing, urinary bowel incontinence. PCP Dr. Allyne Gee  Past Medical History  Diagnosis Date  . COPD (chronic obstructive pulmonary disease)   . History of echocardiogram     a.  2-D echo 01/04/13: EF 55-60%, grade 1 diastolic dysfunction, MAC, trivial MR, PASP 40  . Elevated troponin 12/2012    a.  in setting of viral illness (0.35 => < 0.30);   Lexiscan Myoview 01/07/13: No ischemia, EF 68%, normal stress nuclear study with normal wall motion.  . Chronic back pain   . Sciatica   . Pain management   . RLS (restless legs syndrome)    Past Surgical History  Procedure Laterality Date  . Excision of right  anterior subcutaneous shoulder mass    . Cheilectomy with first mtp joint implant, left foot    . Excision arthritic tibial sesamoid, left foot     History reviewed. No pertinent family history. History  Substance Use Topics  . Smoking status: Current Every Day Smoker  . Smokeless tobacco: Not on file  . Alcohol Use: No   OB History    No data available     Review of Systems  Constitutional: Negative for fever and chills.  Respiratory: Negative for chest tightness and shortness of breath.   Cardiovascular: Negative for chest pain.  Gastrointestinal: Negative for abdominal pain.  Musculoskeletal: Positive for back pain (Chronic) and arthralgias (Right hip).  Neurological: Positive for weakness (Right lower extremity) and numbness (Intermittent to the anterior aspect of the right lower leg). Negative for dizziness and headaches.      Allergies  Review of patient's allergies indicates no known allergies.  Home Medications   Prior to Admission medications   Medication Sig Start Date End Date Taking? Authorizing Provider  amitriptyline (ELAVIL) 25 MG tablet Take 25 mg by mouth at bedtime.   Yes Historical Provider, MD  aspirin EC 81 MG tablet Take 1 tablet (81 mg total) by mouth daily. 01/10/13   Beatrice Lecher, PA-C  budesonide-formoterol (SYMBICORT) 160-4.5 MCG/ACT inhaler Inhale 2 puffs into the lungs 2 (two) times daily.   Yes Historical Provider, MD  clindamycin (CLEOCIN) 300 MG capsule Take 1  capsule (300 mg total) by mouth 4 (four) times daily. 06/06/14   Reuben Likes, MD  gabapentin (NEURONTIN) 300 MG capsule Take 300-600 mg by mouth 2 (two) times daily. 300 mg in the morning and 600 mg at night   Yes Historical Provider, MD  oxyCODONE-acetaminophen (PERCOCET) 7.5-325 MG per tablet  12/29/12  Yes Historical Provider, MD  rOPINIRole (REQUIP) 2 MG tablet Take 2 mg by mouth at bedtime.   Yes Historical Provider, MD   BP 142/79 mmHg  Pulse 65  Temp(Src) 97.9 F (36.6 C) (Oral)   Resp 18  Ht  (1.702 m)  Wt 142 lb (64.411 kg)  BMI 22.24 kg/m2  SpO2 97% Physical Exam  Constitutional: She is oriented to person, place, and time. She appears well-developed and well-nourished. No distress.  HENT:  Head: Normocephalic and atraumatic.  Mouth/Throat: Oropharynx is clear and moist. No oropharyngeal exudate.  Eyes: Conjunctivae and EOM are normal. Right eye exhibits no discharge. Left eye exhibits no discharge.  Neck: Normal range of motion. Neck supple.  Cardiovascular: Normal rate, regular rhythm and normal heart sounds.   Pulses:      Radial pulses are 2+ on the right side, and 2+ on the left side.       Dorsalis pedis pulses are 2+ on the right side, and 2+ on the left side.  Cap refill < 3 seconds Negative swelling or pitting edema noted to lower extremities bilaterally   Pulmonary/Chest: Effort normal and breath sounds normal. No respiratory distress. She has no wheezes. She has no rales.  Musculoskeletal: She exhibits tenderness.       Right hip: She exhibits decreased range of motion (Secondary to pain) and tenderness (Discomfort upon palpation to the acetabulum). She exhibits normal strength, no bony tenderness, no swelling, no crepitus and no deformity.       Lumbar back: She exhibits tenderness. She exhibits normal range of motion, no bony tenderness, no swelling, no edema, no deformity, no laceration and no pain.       Back:       Legs: Neurological: She is alert and oriented to person, place, and time. No cranial nerve deficit. She exhibits normal muscle tone. Coordination normal.  Patient is able to wiggle toes without difficulty Strength 5+/5+ to lower extremities bilaterally Sensation intact with differentiation to sharp and dull touch  Skin: Skin is warm and dry. No rash noted. She is not diaphoretic. No erythema.  Psychiatric: She has a normal mood and affect. Her behavior is normal. Thought content normal.  Nursing note and vitals  reviewed.   ED Course  Procedures (including critical care time)   Clinical Data: Chronic low back pain and bilateral leg pain. Bilateral thigh pain.  MRI LUMBAR SPINE WITHOUT CONTRAST  Technique: Multiplanar and multiecho pulse sequences of the lumbar spine were obtained without intravenous contrast.  Comparison: Radiographs of 03/08/2010.  Findings: The numbering convention used for this exam terms L5-S1 as the last full intervertebral disc space above the sacrum. Alignment anatomic. Spinal cord terminates posterior to the L2 vertebral body. Tiny left renal cyst. Otherwise the paraspinal soft tissues appear within normal limits. There is heterogenous marrow with loss of normal fatty marrow signal, which is a nonspecific finding. It is commonly associated with anemia, chronic disease, obesity, cigarette smoking. Lower thoracic levels appear normal.  L1-L2: Negative. L2-L3: Desiccated and degenerated disc. Broad-based posterior disc bulge. Bilateral facet hypertrophy and ligamentum flavum redundancy. Mild central stenosis with crowding both lateral recesses but  no neural compression. Mild symmetric bilateral foraminal stenosis. L3-L4: Degenerated and desiccated disc. Bilateral facet hypertrophy and ligamentum flavum redundancy. Broad-based posterior disc bulge with mild central stenosis. Bilateral lateral recess stenosis is present, right greater than left with possible compression bilaterally. Moderate symmetric bilateral foraminal stenosis. L4-L5: Desiccated disc with relative present ration of disc height. Broad-based posterior disc bulge. Bilateral facet hypertrophy and ligamentum flavum redundancy. Mild central stenosis. Lateral recesses patent. 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 L5-S1: Disc degeneration and desiccation mild bilateral foraminal stenosis, left greater than right.  Crowding of the lateral recesses, left greater than right potentially producing left S1 radicular symptoms. Bilateral facet hypertrophy and ligamentum flavum redundancy.  IMPRESSION: 1. Multilevel lumbar spondylosis from L2-L3 through L5-S1. Central stenosis and lateral recess stenosis most pronounced at L3- L4. Multilevel facet hypertrophy. 2. Multilevel mild to moderate foraminal stenosis. 3. Diffusely heterogeneous bone marrow which is nonspecific, as discussed above. The patient history sheet notes history of hepatitis C and COPD which probably accounts for the marrow signal.    Results for orders placed or performed during the hospital encounter of 02/18/15  I-stat chem 8, ed  Result Value Ref Range   Sodium 140 135 - 145 mmol/L   Potassium 4.3 3.5 - 5.1 mmol/L   Chloride 104 96 - 112 mmol/L   BUN 16 6 - 23 mg/dL   Creatinine, Ser 7.820.90 0.50 - 1.10 mg/dL   Glucose, Bld 83 70 - 99 mg/dL   Calcium, Ion 9.561.25 2.131.13 - 1.30 mmol/L   TCO2 24 0 - 100 mmol/L   Hemoglobin 15.0 12.0 - 15.0 g/dL   HCT 08.644.0 57.836.0 - 46.946.0 %    Labs Review Labs Reviewed  I-STAT CHEM 8, ED    Imaging Review Dg Lumbar Spine Complete  02/18/2015   CLINICAL DATA:  Right hip pain leg pain for 2 weeks. History of chronic back pain.  EXAM: LUMBAR SPINE - COMPLETE 4+ VIEW  COMPARISON:  07/14/2010  FINDINGS: Mild curvature of the lumbar spine is convex towards the left. The vertebral body heights are well preserved. There is mild multi level disc space narrowing and ventral endplate spurring identified throughout the lumbar spine. There is no fracture or subluxation identified. Atherosclerotic calcifications noted.  IMPRESSION: 1. Lumbar degenerative disc disease and mild scoliosis.   Electronically Signed   By: Signa Kellaylor  Stroud M.D.   On: 02/18/2015 14:33   Dg Hip Unilat With Pelvis 2-3 Views Right  02/18/2015   CLINICAL DATA:  Right hip pain for the past 2 weeks. No known injury.  EXAM: RIGHT HIP (WITH  PELVIS) 2-3 VIEWS  COMPARISON:  None.  FINDINGS: Mild right femoral head and neck junction and lateral acetabular spur formation. Moderate left femoral head and neck junction and acetabular spur formation. Lower lumbar spine degenerative changes.  IMPRESSION: 1. Mild right hip degenerative changes and moderate left hip degenerative changes. 2. Lower lumbar spine degenerative changes.   Electronically Signed   By: Beckie SaltsSteven  Reid M.D.   On: 02/18/2015 14:29     EKG Interpretation None      3:46 PM Patient ambulated in the ED with walker - patient able to apply pressure to the right leg without difficulty. Attending physician, Dr. Merlene LaughterK. McManus, saw patient ambulate in hall without difficulty - did not recommend any further imaging and reported that patient can be discharged home.   MDM   Final diagnoses:  Right hip pain  Osteoarthritis of right hip, unspecified osteoarthritis  type    Medications  fentaNYL (SUBLIMAZE) injection 50 mcg (50 mcg Intravenous Given 02/18/15 1526)    Filed Vitals:   02/18/15 1245 02/18/15 1330 02/18/15 1530 02/18/15 1636  BP: 129/74 159/85 119/56 142/79  Pulse: 70 69 70 65  Temp:      TempSrc:      Resp: 13 10 16 18   Height:      Weight:      SpO2: 96% 100% 95% 97%    Patient has history of chronic back pain and left hip pain - being seen by pain management. Last MRI of the lumbar spine with findings of degenerative disease - negative spinal cord compression.  I-STAT Chem-8 unremarkable-negative findings of hyper or hypokalemia. Plain film of right hip noted mild right hip degenerative changes and moderate left hip skin or changes with lower lumbar spine degenerative changes.  Patient given fentanyl in ED setting with positive relief of pain. Patient and related well with walker. Negative focal neurological deficits. Pulses palpable and strong. Cap refill < 3 seconds. Negative signs of ischemia. Negative foot drag. Discussed case in great detail with attending  physician, Dr. Merlene Laughter - physician saw patient walk in the hallway as well. Recommended patient to be discharged home. Doubt cauda equina. Doubt epidural abscess. Doubt septic joint. Suspicion to be degenerative joint disease, arthritis. Patient stable, afebrile. Patient not septic appearing. Discharged patient. Discussed with patient to follow-up pain management specialist and primary care provider. Referred patient to orthopedics. Patient has oxycodone and gabapentin at home. Discussed with patient to rest and stay hydrated. Discussed with patient to avoid any physical strenuous activity. Discussed with patient to closely monitor symptoms and if symptoms are to worsen or change to report back to the ED - strict return instructions given.  Patient agreed to plan of care, understood, all questions answered.   Raymon Mutton, PA-C 02/18/15 1649  Samuel Jester, DO 02/21/15 1550

## 2015-02-18 NOTE — ED Notes (Signed)
Pt ambulated in hallway with limp; walked with cane.

## 2015-02-18 NOTE — Discharge Instructions (Signed)
Please call your doctor for a followup appointment within 24-48 hours. When you talk to your doctor please let them know that you were seen in the emergency department and have them acquire all of your records so that they can discuss the findings with you and formulate a treatment plan to fully care for your new and ongoing problems. Please follow up with your primary care provider Please follow up with Orthopedics regarding right and left hip pain  Please discuss this with your pain management specialists Please continue to take pain medications as prescribed. Please remember there is no drinking alcohol, driving, operating any machinery while on this medication. Please do not take Tylenol for this can lead to Tylenol overdose and liver issues. Please continue to monitor symptoms closely and if symptoms are to worsen or change (fever greater than 101, chills, sweating, nausea, vomiting, chest pain, shortness of breathe, difficulty breathing, weakness, numbness, tingling, worsening or changes to pain pattern, inability to control urine or bowel movements, direct trauma, fall, injury, inability to move the right leg, swelling, changes to skin color, hot to touch) please report back to the Emergency Department immediately.   Osteoarthritis Osteoarthritis is a disease that causes soreness and inflammation of a joint. It occurs when the cartilage at the affected joint wears down. Cartilage acts as a cushion, covering the ends of bones where they meet to form a joint. Osteoarthritis is the most common form of arthritis. It often occurs in older people. The joints affected most often by this condition include those in the:  Ends of the fingers.  Thumbs.  Neck.  Lower back.  Knees.  Hips. CAUSES  Over time, the cartilage that covers the ends of bones begins to wear away. This causes bone to rub on bone, producing pain and stiffness in the affected joints.  RISK FACTORS Certain factors can increase  your chances of having osteoarthritis, including:  Older age.  Excessive body weight.  Overuse of joints.  Previous joint injury. SIGNS AND SYMPTOMS   Pain, swelling, and stiffness in the joint.  Over time, the joint may lose its normal shape.  Small deposits of bone (osteophytes) may grow on the edges of the joint.  Bits of bone or cartilage can break off and float inside the joint space. This may cause more pain and damage. DIAGNOSIS  Your health care provider will do a physical exam and ask about your symptoms. Various tests may be ordered, such as:  X-rays of the affected joint.  An MRI scan.  Blood tests to rule out other types of arthritis.  Joint fluid tests. This involves using a needle to draw fluid from the joint and examining the fluid under a microscope. TREATMENT  Goals of treatment are to control pain and improve joint function. Treatment plans may include:  A prescribed exercise program that allows for rest and joint relief.  A weight control plan.  Pain relief techniques, such as:  Properly applied heat and cold.  Electric pulses delivered to nerve endings under the skin (transcutaneous electrical nerve stimulation [TENS]).  Massage.  Certain nutritional supplements.  Medicines to control pain, such as:  Acetaminophen.  Nonsteroidal anti-inflammatory drugs (NSAIDs), such as naproxen.  Narcotic or central-acting agents, such as tramadol.  Corticosteroids. These can be given orally or as an injection.  Surgery to reposition the bones and relieve pain (osteotomy) or to remove loose pieces of bone and cartilage. Joint replacement may be needed in advanced states of osteoarthritis. HOME CARE INSTRUCTIONS  Take medicines only as directed by your health care provider.  Maintain a healthy weight. Follow your health care provider's instructions for weight control. This may include dietary instructions.  Exercise as directed. Your health care  provider can recommend specific types of exercise. These may include:  Strengthening exercises. These are done to strengthen the muscles that support joints affected by arthritis. They can be performed with weights or with exercise bands to add resistance.  Aerobic activities. These are exercises, such as brisk walking or low-impact aerobics, that get your heart pumping.  Range-of-motion activities. These keep your joints limber.  Balance and agility exercises. These help you maintain daily living skills.  Rest your affected joints as directed by your health care provider.  Keep all follow-up visits as directed by your health care provider. SEEK MEDICAL CARE IF:   Your skin turns red.  You develop a rash in addition to your joint pain.  You have worsening joint pain.  You have a fever along with joint or muscle aches. SEEK IMMEDIATE MEDICAL CARE IF:  You have a significant loss of weight or appetite.  You have night sweats. FOR MORE INFORMATION   National Institute of Arthritis and Musculoskeletal and Skin Diseases: www.niams.http://www.myers.net/nih.gov  [employer]: https://walker.com/www.nia.nih.gov  American College of Rheumatology: www.rheumatology.org Document Released: 12/15/2005 Document Revised: 05/01/2014 Document Reviewed: 08/22/2013 [facility] Patient Information 2015 [employer], [place]. This information is not intended to replace advice given to you by your health care provider. Make sure you discuss any questions you have with your health care provider.

## 2015-02-21 DIAGNOSIS — Z09 Encounter for follow-up examination after completed treatment for conditions other than malignant neoplasm: Secondary | ICD-10-CM | POA: Diagnosis not present

## 2015-02-21 DIAGNOSIS — M818 Other osteoporosis without current pathological fracture: Secondary | ICD-10-CM | POA: Diagnosis not present

## 2015-02-21 DIAGNOSIS — Z79899 Other long term (current) drug therapy: Secondary | ICD-10-CM | POA: Diagnosis not present

## 2015-02-21 DIAGNOSIS — R102 Pelvic and perineal pain: Secondary | ICD-10-CM | POA: Diagnosis not present

## 2015-02-21 DIAGNOSIS — M169 Osteoarthritis of hip, unspecified: Secondary | ICD-10-CM | POA: Diagnosis not present

## 2015-02-21 DIAGNOSIS — Z5181 Encounter for therapeutic drug level monitoring: Secondary | ICD-10-CM | POA: Diagnosis not present

## 2015-03-08 DIAGNOSIS — M1611 Unilateral primary osteoarthritis, right hip: Secondary | ICD-10-CM | POA: Diagnosis not present

## 2015-03-08 DIAGNOSIS — M5410 Radiculopathy, site unspecified: Secondary | ICD-10-CM | POA: Diagnosis not present

## 2015-03-12 DIAGNOSIS — H52203 Unspecified astigmatism, bilateral: Secondary | ICD-10-CM | POA: Diagnosis not present

## 2015-03-12 DIAGNOSIS — H5210 Myopia, unspecified eye: Secondary | ICD-10-CM | POA: Diagnosis not present

## 2015-03-21 DIAGNOSIS — M1611 Unilateral primary osteoarthritis, right hip: Secondary | ICD-10-CM | POA: Diagnosis not present

## 2015-03-29 DIAGNOSIS — M25552 Pain in left hip: Secondary | ICD-10-CM | POA: Diagnosis not present

## 2015-04-02 DIAGNOSIS — M1612 Unilateral primary osteoarthritis, left hip: Secondary | ICD-10-CM | POA: Diagnosis not present

## 2015-04-02 DIAGNOSIS — M545 Low back pain: Secondary | ICD-10-CM | POA: Diagnosis not present

## 2015-04-02 DIAGNOSIS — Z720 Tobacco use: Secondary | ICD-10-CM | POA: Diagnosis not present

## 2015-04-10 DIAGNOSIS — Z0000 Encounter for general adult medical examination without abnormal findings: Secondary | ICD-10-CM | POA: Diagnosis not present

## 2015-04-10 DIAGNOSIS — I119 Hypertensive heart disease without heart failure: Secondary | ICD-10-CM | POA: Diagnosis not present

## 2015-04-10 DIAGNOSIS — E559 Vitamin D deficiency, unspecified: Secondary | ICD-10-CM | POA: Diagnosis not present

## 2015-04-10 DIAGNOSIS — J449 Chronic obstructive pulmonary disease, unspecified: Secondary | ICD-10-CM | POA: Diagnosis not present

## 2015-04-10 DIAGNOSIS — Z79899 Other long term (current) drug therapy: Secondary | ICD-10-CM | POA: Diagnosis not present

## 2015-04-12 DIAGNOSIS — I119 Hypertensive heart disease without heart failure: Secondary | ICD-10-CM | POA: Diagnosis not present

## 2015-04-12 DIAGNOSIS — Z0000 Encounter for general adult medical examination without abnormal findings: Secondary | ICD-10-CM | POA: Diagnosis not present

## 2015-04-12 DIAGNOSIS — Z79899 Other long term (current) drug therapy: Secondary | ICD-10-CM | POA: Diagnosis not present

## 2015-04-12 DIAGNOSIS — E559 Vitamin D deficiency, unspecified: Secondary | ICD-10-CM | POA: Diagnosis not present

## 2015-04-12 DIAGNOSIS — M1611 Unilateral primary osteoarthritis, right hip: Secondary | ICD-10-CM | POA: Diagnosis not present

## 2015-04-12 DIAGNOSIS — J449 Chronic obstructive pulmonary disease, unspecified: Secondary | ICD-10-CM | POA: Diagnosis not present

## 2015-04-23 DIAGNOSIS — M25552 Pain in left hip: Secondary | ICD-10-CM | POA: Diagnosis not present

## 2015-04-23 DIAGNOSIS — M1612 Unilateral primary osteoarthritis, left hip: Secondary | ICD-10-CM | POA: Diagnosis not present

## 2015-05-02 IMAGING — CR DG HIP (WITH OR WITHOUT PELVIS) 2-3V*R*
1 series · 1 of 1 positions shown · non-contrast
Comparison: None.

CLINICAL DATA: Right hip pain for the past 2 weeks. No known
injury.

EXAM:
RIGHT HIP (WITH PELVIS) 2-3 VIEWS

[t pelvis ap]
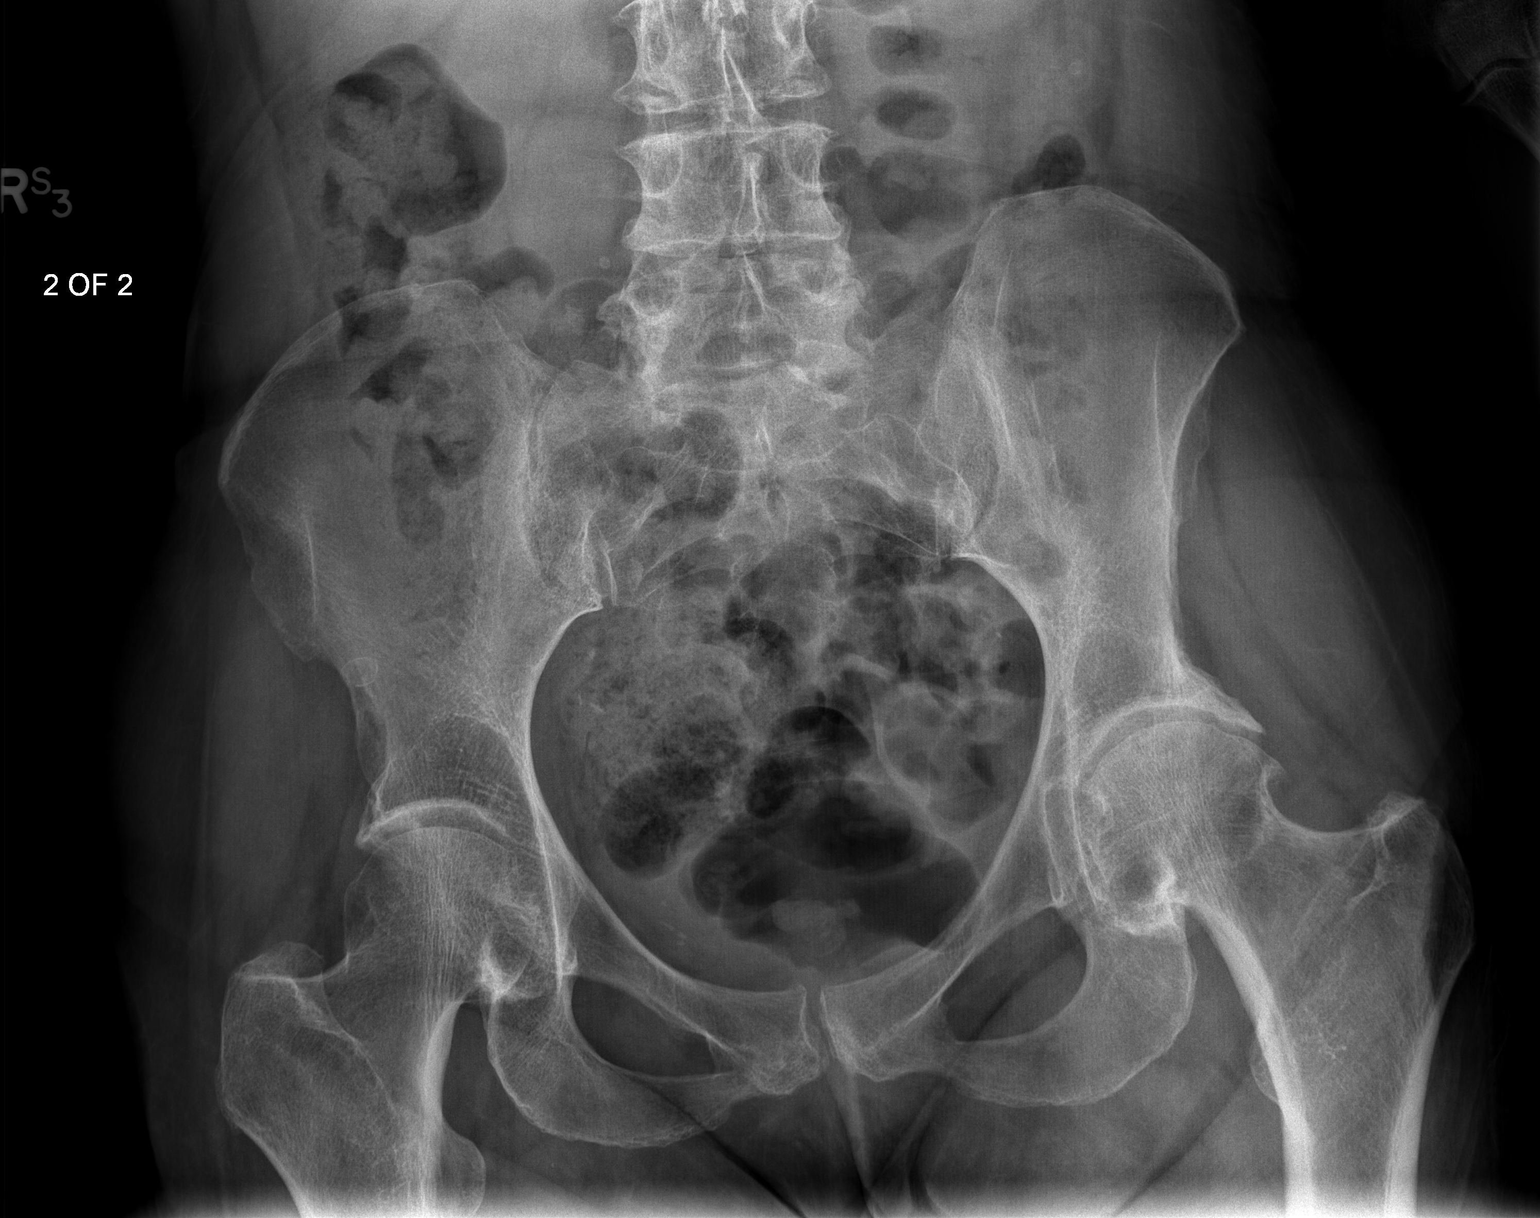

[1 of 1 positions shown; findings below may reference images not displayed]

FINDINGS: Mild right femoral head and neck junction and lateral acetabular
spur formation. Moderate left femoral head and neck junction and
acetabular spur formation. Lower lumbar spine degenerative changes.
IMPRESSION: 1. Mild right hip degenerative changes and moderate left hip
degenerative changes.
2. Lower lumbar spine degenerative changes.

## 2015-05-19 DIAGNOSIS — M1612 Unilateral primary osteoarthritis, left hip: Secondary | ICD-10-CM | POA: Diagnosis not present

## 2015-05-19 DIAGNOSIS — J449 Chronic obstructive pulmonary disease, unspecified: Secondary | ICD-10-CM | POA: Diagnosis not present

## 2015-05-19 DIAGNOSIS — Z87891 Personal history of nicotine dependence: Secondary | ICD-10-CM | POA: Diagnosis not present

## 2015-05-19 DIAGNOSIS — Z01818 Encounter for other preprocedural examination: Secondary | ICD-10-CM | POA: Diagnosis not present

## 2015-06-12 ENCOUNTER — Other Ambulatory Visit: Payer: Self-pay | Admitting: Internal Medicine

## 2015-06-12 DIAGNOSIS — R0600 Dyspnea, unspecified: Secondary | ICD-10-CM

## 2015-06-13 ENCOUNTER — Ambulatory Visit (INDEPENDENT_AMBULATORY_CARE_PROVIDER_SITE_OTHER)
Admission: RE | Admit: 2015-06-13 | Discharge: 2015-06-13 | Disposition: A | Discharge status: Home / Self Care | Payer: Commercial Managed Care - HMO | Source: Ambulatory Visit | Attending: Internal Medicine | Admitting: Internal Medicine

## 2015-06-13 ENCOUNTER — Ambulatory Visit (INDEPENDENT_AMBULATORY_CARE_PROVIDER_SITE_OTHER): Payer: Commercial Managed Care - HMO | Admitting: Internal Medicine

## 2015-06-13 ENCOUNTER — Encounter: Payer: Self-pay | Admitting: Internal Medicine

## 2015-06-13 DIAGNOSIS — R0600 Dyspnea, unspecified: Secondary | ICD-10-CM

## 2015-06-13 DIAGNOSIS — J449 Chronic obstructive pulmonary disease, unspecified: Secondary | ICD-10-CM

## 2015-06-13 LAB — PULMONARY FUNCTION TEST
DL/VA % PRED: 53 %
DL/VA: 2.8 ml/min/mmHg/L
DLCO UNC: 14.42 ml/min/mmHg
DLCO unc % pred: 49 %
FEF 25-75 PRE: 0.93 L/s
FEF 25-75 Post: 0.99 L/sec
FEF2575-%CHANGE-POST: 6 %
FEF2575-%PRED-PRE: 47 %
FEF2575-%Pred-Post: 50 %
FEV1-%Change-Post: 0 %
FEV1-%PRED-POST: 88 %
FEV1-%Pred-Pre: 87 %
FEV1-Post: 1.9 L
FEV1-Pre: 1.89 L
FEV1FVC-%CHANGE-POST: -3 %
FEV1FVC-%Pred-Pre: 84 %
FEV6-%CHANGE-POST: 2 %
FEV6-%PRED-POST: 108 %
FEV6-%Pred-Pre: 105 %
FEV6-Post: 2.91 L
FEV6-Pre: 2.83 L
FEV6FVC-%Change-Post: -1 %
FEV6FVC-%PRED-POST: 99 %
FEV6FVC-%PRED-PRE: 101 %
FVC-%CHANGE-POST: 4 %
FVC-%PRED-POST: 108 %
FVC-%PRED-PRE: 104 %
FVC-PRE: 2.89 L
FVC-Post: 3.01 L
POST FEV6/FVC RATIO: 97 %
Post FEV1/FVC ratio: 63 %
Pre FEV1/FVC ratio: 65 %
Pre FEV6/FVC Ratio: 98 %
RV % pred: 77 %
RV: 1.83 L
TLC % PRED: 87 %
TLC: 4.86 L

## 2015-06-13 NOTE — Patient Instructions (Addendum)
Please remember to go to the  x-ray department downstairs for your tests - we will call you with the results when they are available.  Continue symbicort 160 Take 2 puffs first thing in am and then another 2 puffs about 12 hours later.   You only have mild copd and should do fine unless you start smoking again    You are cleared for surgery

## 2015-06-13 NOTE — Progress Notes (Signed)
PFT done today. 

## 2015-06-13 NOTE — Progress Notes (Signed)
Subjective:     Patient ID: Jenna Stanley, female   DOB: 1946/08/20,    MRN: [id number]  HPI  63 yobf quit smoking 04/2015 with GOLD I copd by pfts 06/13/15 seen for preop consultation at the request of Dr Maggie Font for [facility]    06/13/2015 1st [facility] office visit/ [name]   Chief Complaint  Patient presents with  . Pulmonary Consult    Referred by Dr Allyne Gee. PFT done today. Pt denies SOB, cough, wheezing, chest tightness or pain   she reports was dx 2007 with indolent onset  mild doe but better on symbicort and now limited by hip/ not sob x 2 years. Sleeps fine/ no am cough/ congestion    No obvious day to day or daytime variabilty or assoc chronic cough or cp or chest tightness, subjective wheeze overt sinus or hb symptoms. No unusual exp hx or h/o childhood pna/ asthma or knowledge of premature birth.  Sleeping ok without nocturnal  or early am exacerbation  of respiratory  c/o's or need for noct saba. Also denies any obvious fluctuation of symptoms with weather or environmental changes or other aggravating or alleviating factors except as outlined above   Current Medications, Allergies, Complete Past Medical History, Past Surgical History, Family History, and Social History were reviewed in [employer] record.  ROS  The following are not active complaints unless bolded sore throat, dysphagia, dental problems, itching, sneezing,  nasal congestion or excess/ purulent secretions, ear ache,   fever, chills, sweats, unintended wt loss, pleuritic or exertional cp, hemoptysis,  orthopnea pnd or leg swelling, presyncope, palpitations, heartburn, abdominal pain, anorexia, nausea, vomiting, diarrhea  or change in bowel or urinary habits, change in stools or urine, dysuria,hematuria,  rash, arthralgias, visual complaints, headache, numbness weakness or ataxia or problems with walking or coordination,  change in mood/affect or memory.       Review of Systems      Objective:   Physical Exam amb thin bf nad   Wt Readings from Last 3 Encounters:  06/13/15 140 lb (63.504 kg)  02/18/15 142 lb (64.411 kg)  01/10/13 156 lb (70.761 kg)    Vital signs reviewed   HEENT: edentulous, nl turbinates, and orophanx. Nl external ear canals without cough reflex   NECK :  without JVD/Nodes/TM/ nl carotid upstrokes bilaterally   LUNGS: no acc muscle use, clear to A and P bilaterally with slt distant bs, no wheezes or rhonchi or cough on insp/exp    CV:  RRR  no s3 or murmur or increase in P2, no edema   ABD:  soft and nontender with nl excursion in the supine position. No bruits or organomegaly, bowel sounds nl  MS:  warm without deformities, calf tenderness, cyanosis or clubbing  SKIN: warm and dry without lesions    NEURO:  alert, approp, no deficits    CXR PA and Lateral:   06/13/2015 :     I personally reviewed images and agree with radiology impression as follows:    mild T scoliosis/ copd/ no acute changes             Assessment:

## 2015-06-14 ENCOUNTER — Encounter: Payer: Self-pay | Admitting: Internal Medicine

## 2015-06-14 NOTE — Progress Notes (Signed)
Quick Note:  Spoke with pt and notified of results per Dr. Wert. Pt verbalized understanding and denied any questions.  ______ 

## 2015-06-14 NOTE — Assessment & Plan Note (Signed)
-   Spirometry 06/11/12   FEV1  1.56 (72%) ratio 65 off rx  - quit smoking 04/2015  - Full pfts 06/13/2015   FEV1  1.90 ( 88%) ratio 63 with no change p saba and dlco 49 corrects 53% p am symbicort 160    I had an extended discussion with the patient reviewing all relevant studies completed to date and  lasting  35 m  1) she stopped smoking while her "best day" spirometry was still quite good/ albeit p am symbicort  2) she could have an asthmatic component and for sure should continue the symbicort until post surgery but p surgery could also try off to see what if any symptoms she has  3)   I  reviewed the Fletcher curve with the patient that basically indicates  if you quit smoking when your best day FEV1 is still well preserved (as is clearly  the case here)  it is highly unlikely you will progress to severe disease and informed the patient there was no medication on the market that has proven to alter the curve/ its downward trajectory  or the likelihood of progression of their disease.  Therefore stopping  maintaining abstinence FROM CIGS is the most important aspect of care, not choice of inhalers or for that matter, doctors.    4) she is cleared for hip  surgery   5) Each maintenance medication was reviewed in detail including most importantly the difference between maintenance and as needed and under what circumstances the prns are to be used.  Please see instructions for details which were reviewed in writing and the patient given a copy.    6) no pulmonary f/u needed

## 2015-06-26 DIAGNOSIS — Z720 Tobacco use: Secondary | ICD-10-CM | POA: Diagnosis not present

## 2015-06-26 DIAGNOSIS — M1612 Unilateral primary osteoarthritis, left hip: Secondary | ICD-10-CM | POA: Diagnosis not present

## 2015-06-26 DIAGNOSIS — M4806 Spinal stenosis, lumbar region: Secondary | ICD-10-CM | POA: Diagnosis not present

## 2015-07-27 ENCOUNTER — Other Ambulatory Visit (HOSPITAL_COMMUNITY): Payer: Self-pay | Admitting: Orthopaedic Surgery

## 2015-08-01 DIAGNOSIS — Z1231 Encounter for screening mammogram for malignant neoplasm of breast: Secondary | ICD-10-CM | POA: Diagnosis not present

## 2015-08-01 DIAGNOSIS — Z803 Family history of malignant neoplasm of breast: Secondary | ICD-10-CM | POA: Diagnosis not present

## 2015-08-03 ENCOUNTER — Encounter (HOSPITAL_COMMUNITY): Payer: Self-pay

## 2015-08-03 ENCOUNTER — Encounter (HOSPITAL_COMMUNITY)
Admission: RE | Admit: 2015-08-03 | Discharge: 2015-08-03 | Disposition: A | Discharge status: Home / Self Care | Payer: Commercial Managed Care - HMO | Source: Ambulatory Visit | Attending: Orthopaedic Surgery | Admitting: Orthopaedic Surgery

## 2015-08-03 ENCOUNTER — Other Ambulatory Visit: Payer: Self-pay

## 2015-08-03 DIAGNOSIS — Z01812 Encounter for preprocedural laboratory examination: Secondary | ICD-10-CM | POA: Insufficient documentation

## 2015-08-03 DIAGNOSIS — M1612 Unilateral primary osteoarthritis, left hip: Secondary | ICD-10-CM | POA: Insufficient documentation

## 2015-08-03 DIAGNOSIS — Z0183 Encounter for blood typing: Secondary | ICD-10-CM | POA: Diagnosis not present

## 2015-08-03 HISTORY — DX: Unspecified osteoarthritis, unspecified site: M19.90

## 2015-08-03 LAB — COMPREHENSIVE METABOLIC PANEL
ALBUMIN: 4 g/dL (ref 3.5–5.0)
ALK PHOS: 100 U/L (ref 38–126)
ALT: 35 U/L (ref 14–54)
AST: 57 U/L — ABNORMAL HIGH (ref 15–41)
Anion gap: 9 (ref 5–15)
BILIRUBIN TOTAL: 0.4 mg/dL (ref 0.3–1.2)
BUN: 9 mg/dL (ref 6–20)
CALCIUM: 10 mg/dL (ref 8.9–10.3)
CO2: 28 mmol/L (ref 22–32)
Chloride: 103 mmol/L (ref 101–111)
Creatinine, Ser: 0.91 mg/dL (ref 0.44–1.00)
GFR calc non Af Amer: >60 mL/min (ref >60)
Glucose, Bld: 91 mg/dL (ref 65–99)
Potassium: 3.4 mmol/L — ABNORMAL LOW (ref 3.5–5.1)
Sodium: 140 mmol/L (ref 135–145)
TOTAL PROTEIN: 7.7 g/dL (ref 6.5–8.1)

## 2015-08-03 LAB — ABO/RH: ABO/RH(D): O POS

## 2015-08-03 LAB — CBC WITH DIFFERENTIAL/PLATELET
Basophils Absolute: 0 10*3/uL (ref 0.0–0.1)
Basophils Relative: 0 % (ref 0–1)
EOS ABS: 0.2 10*3/uL (ref 0.0–0.7)
Eosinophils Relative: 2 % (ref 0–5)
HCT: 38.9 % (ref 36.0–46.0)
HEMOGLOBIN: 12.7 g/dL (ref 12.0–15.0)
Lymphocytes Relative: 29 % (ref 12–46)
Lymphs Abs: 1.9 10*3/uL (ref 0.7–4.0)
MCH: 31.7 pg (ref 26.0–34.0)
MCHC: 32.6 g/dL (ref 30.0–36.0)
MCV: 97 fL (ref 78.0–100.0)
Monocytes Absolute: 0.4 10*3/uL (ref 0.1–1.0)
Monocytes Relative: 6 % (ref 3–12)
NEUTROS ABS: 4.2 10*3/uL (ref 1.7–7.7)
Neutrophils Relative %: 63 % (ref 43–77)
Platelets: 304 10*3/uL (ref 150–400)
RBC: 4.01 MIL/uL (ref 3.87–5.11)
RDW: 14 % (ref 11.5–15.5)
WBC: 6.7 10*3/uL (ref 4.0–10.5)

## 2015-08-03 LAB — URINALYSIS, ROUTINE W REFLEX MICROSCOPIC
Bilirubin Urine: NEGATIVE
GLUCOSE, UA: NEGATIVE mg/dL
Ketones, ur: NEGATIVE mg/dL
Nitrite: POSITIVE — AB
PH: 5.5 (ref 5.0–8.0)
Protein, ur: NEGATIVE mg/dL
Specific Gravity, Urine: 1.02 (ref 1.005–1.030)
UROBILINOGEN UA: 0.2 mg/dL (ref 0.0–1.0)

## 2015-08-03 LAB — SURGICAL PCR SCREEN
MRSA, PCR: NEGATIVE
STAPHYLOCOCCUS AUREUS: NEGATIVE

## 2015-08-03 LAB — URINE MICROSCOPIC-ADD ON

## 2015-08-03 LAB — C-REACTIVE PROTEIN: CRP: <0.5 mg/dL (ref <1.0)

## 2015-08-03 LAB — PROTIME-INR
INR: 1.08 (ref 0.00–1.49)
Prothrombin Time: 14.2 seconds (ref 11.6–15.2)

## 2015-08-03 LAB — APTT: aPTT: 34 seconds (ref 24–37)

## 2015-08-03 NOTE — Pre-Procedure Instructions (Addendum)
Jenna Stanley  08/03/2015      [facility] [address] [name], [place] - [address] AT [facility] [address] [name] [address] Phone: [phone] Fax: [phone]  [facility] - [place], [place] - [name] [address] Phone: [phone] Fax: [phone]    Your procedure is scheduled on August 17th, Wednesday   Report to [name] Admitting at 10:30 AM  Call this number if you have problems the morning of surgery:  [phone]   Remember:  Do not eat food or drink liquids after midnight Tuesday.  Take these medicines the morning of surgery with A SIP OF WATER : Oxycodone.  Please use your inhaler the morning of surgery.   Do not wear jewelry, no rings or watches.  Do not wear lotions or perfumes   You may NOT wear deodorant the morning of surgery.             Do Not shave your underarms & legs 48 hrs prior to using the body wash.    Do not bring valuables to the hospital.  [name] is not responsible for any belongings or valuables.  Contacts, dentures or bridgework may not be worn into surgery.  Leave your suitcase in the car.  After surgery it may be brought to your room. For patients admitted to the hospital, discharge time will be determined by your treatment team.    Name and phone number of your driver:     Special instructions:  "Preparing for Surgery" instruction sheet.  Please read over the following fact sheets that you were given. Pain Booklet, Coughing and Deep Breathing, Blood Transfusion Information, MRSA Information and Surgical Site Infection Prevention

## 2015-08-04 LAB — SEDIMENTATION RATE: Sed Rate: 45 mm/hr — ABNORMAL HIGH (ref 0–22)

## 2015-08-06 NOTE — Progress Notes (Signed)
Called Dr. Warren Danes office with UA results, left message with Carollee Herter who will forward the info to Dr. Roda Shutters.

## 2015-08-14 MED ORDER — CHLORHEXIDINE GLUCONATE 4 % EX LIQD: 60.0000 mL | Freq: Once | CUTANEOUS | Status: DC

## 2015-08-14 MED ORDER — BUPIVACAINE LIPOSOME 1.3 % IJ SUSP
20.0000 mL | INTRAMUSCULAR | Status: DC
  Filled 2015-08-14 (×2): qty 20

## 2015-08-14 MED ORDER — CEFAZOLIN SODIUM-DEXTROSE 2-3 GM-% IV SOLR
2.0000 g | INTRAVENOUS | Status: AC
  Administered 2015-08-15: 2 g via INTRAVENOUS
  Filled 2015-08-14: qty 50

## 2015-08-15 ENCOUNTER — Inpatient Hospital Stay (HOSPITAL_COMMUNITY): Payer: Commercial Managed Care - HMO

## 2015-08-15 ENCOUNTER — Encounter (HOSPITAL_COMMUNITY)
Admission: RE | Disposition: A | Discharge status: Home Health | Payer: Self-pay | Source: Ambulatory Visit | Attending: Orthopaedic Surgery

## 2015-08-15 ENCOUNTER — Inpatient Hospital Stay (HOSPITAL_COMMUNITY): Payer: Commercial Managed Care - HMO | Admitting: Anesthesiology

## 2015-08-15 ENCOUNTER — Encounter (HOSPITAL_COMMUNITY): Payer: Self-pay | Admitting: *Deleted

## 2015-08-15 ENCOUNTER — Inpatient Hospital Stay (HOSPITAL_COMMUNITY)
Admission: RE | Admit: 2015-08-15 | Discharge: 2015-08-18 | DRG: 470 | Disposition: A | Discharge status: Home Health | Payer: Commercial Managed Care - HMO | Source: Ambulatory Visit | Attending: Orthopaedic Surgery | Admitting: Orthopaedic Surgery

## 2015-08-15 DIAGNOSIS — D62 Acute posthemorrhagic anemia: Secondary | ICD-10-CM | POA: Diagnosis not present

## 2015-08-15 DIAGNOSIS — Z8619 Personal history of other infectious and parasitic diseases: Secondary | ICD-10-CM | POA: Diagnosis not present

## 2015-08-15 DIAGNOSIS — G2581 Restless legs syndrome: Secondary | ICD-10-CM | POA: Diagnosis present

## 2015-08-15 DIAGNOSIS — Z419 Encounter for procedure for purposes other than remedying health state, unspecified: Secondary | ICD-10-CM

## 2015-08-15 DIAGNOSIS — G8929 Other chronic pain: Secondary | ICD-10-CM | POA: Diagnosis not present

## 2015-08-15 DIAGNOSIS — Z471 Aftercare following joint replacement surgery: Secondary | ICD-10-CM | POA: Diagnosis not present

## 2015-08-15 DIAGNOSIS — Z96642 Presence of left artificial hip joint: Secondary | ICD-10-CM | POA: Diagnosis not present

## 2015-08-15 DIAGNOSIS — F419 Anxiety disorder, unspecified: Secondary | ICD-10-CM | POA: Diagnosis present

## 2015-08-15 DIAGNOSIS — M549 Dorsalgia, unspecified: Secondary | ICD-10-CM | POA: Diagnosis present

## 2015-08-15 DIAGNOSIS — M1612 Unilateral primary osteoarthritis, left hip: Secondary | ICD-10-CM | POA: Diagnosis not present

## 2015-08-15 DIAGNOSIS — Z888 Allergy status to other drugs, medicaments and biological substances status: Secondary | ICD-10-CM

## 2015-08-15 DIAGNOSIS — M1610 Unilateral primary osteoarthritis, unspecified hip: Secondary | ICD-10-CM | POA: Diagnosis present

## 2015-08-15 DIAGNOSIS — F17210 Nicotine dependence, cigarettes, uncomplicated: Secondary | ICD-10-CM | POA: Diagnosis not present

## 2015-08-15 DIAGNOSIS — J449 Chronic obstructive pulmonary disease, unspecified: Secondary | ICD-10-CM | POA: Diagnosis present

## 2015-08-15 DIAGNOSIS — Z79899 Other long term (current) drug therapy: Secondary | ICD-10-CM | POA: Diagnosis not present

## 2015-08-15 DIAGNOSIS — R269 Unspecified abnormalities of gait and mobility: Secondary | ICD-10-CM | POA: Diagnosis not present

## 2015-08-15 HISTORY — PX: TOTAL HIP ARTHROPLASTY: SHX124

## 2015-08-15 HISTORY — DX: Unspecified viral hepatitis C without hepatic coma: B19.20

## 2015-08-15 HISTORY — DX: Fibromyalgia: M79.7

## 2015-08-15 HISTORY — DX: Migraine, unspecified, not intractable, without status migrainosus: G43.909

## 2015-08-15 HISTORY — DX: Low back pain, unspecified: M54.50

## 2015-08-15 HISTORY — DX: Low back pain: M54.5

## 2015-08-15 HISTORY — DX: Unspecified chronic bronchitis: J42

## 2015-08-15 HISTORY — DX: Anxiety disorder, unspecified: F41.9

## 2015-08-15 HISTORY — DX: Pneumonia, unspecified organism: J18.9

## 2015-08-15 HISTORY — DX: Other chronic pain: G89.29

## 2015-08-15 LAB — CBC
HCT: 32 % — ABNORMAL LOW (ref 36.0–46.0)
HEMOGLOBIN: 10.5 g/dL — AB (ref 12.0–15.0)
MCH: 31.3 pg (ref 26.0–34.0)
MCHC: 32.8 g/dL (ref 30.0–36.0)
MCV: 95.2 fL (ref 78.0–100.0)
PLATELETS: 290 10*3/uL (ref 150–400)
RBC: 3.36 MIL/uL — AB (ref 3.87–5.11)
RDW: 13.8 % (ref 11.5–15.5)
WBC: 11.3 10*3/uL — AB (ref 4.0–10.5)

## 2015-08-15 LAB — CREATININE, SERUM
CREATININE: 0.77 mg/dL (ref 0.44–1.00)
GFR calc Af Amer: >60 mL/min (ref >60)

## 2015-08-15 SURGERY — ARTHROPLASTY, HIP, TOTAL, ANTERIOR APPROACH
Anesthesia: Monitor Anesthesia Care | Site: Hip | Laterality: Left

## 2015-08-15 MED ORDER — METOCLOPRAMIDE HCL 5 MG/ML IJ SOLN: 5.0000 mg | Freq: Three times a day (TID) | INTRAMUSCULAR | Status: DC | PRN

## 2015-08-15 MED ORDER — MEPERIDINE HCL 25 MG/ML IJ SOLN
INTRAMUSCULAR | Status: AC
  Filled 2015-08-15: qty 1

## 2015-08-15 MED ORDER — CELECOXIB 200 MG PO CAPS
200.0000 mg | ORAL_CAPSULE | Freq: Two times a day (BID) | ORAL | Status: DC
  Administered 2015-08-15 – 2015-08-18 (×6): 200 mg via ORAL
  Filled 2015-08-15 (×6): qty 1

## 2015-08-15 MED ORDER — FENTANYL CITRATE (PF) 100 MCG/2ML IJ SOLN
INTRAMUSCULAR | Status: DC | PRN
  Administered 2015-08-15 (×2): 50 ug via INTRAVENOUS

## 2015-08-15 MED ORDER — PROMETHAZINE HCL 25 MG PO TABS: 25.0000 mg | ORAL_TABLET | Freq: Four times a day (QID) | ORAL | Status: AC | PRN

## 2015-08-15 MED ORDER — PROPOFOL INFUSION 10 MG/ML OPTIME
INTRAVENOUS | Status: DC | PRN
  Administered 2015-08-15: 50 ug/kg/min via INTRAVENOUS

## 2015-08-15 MED ORDER — POVIDONE-IODINE 10 % EX SOLN
CUTANEOUS | Status: DC | PRN
  Administered 2015-08-15: 17.5 via TOPICAL

## 2015-08-15 MED ORDER — LACTATED RINGERS IV SOLN
INTRAVENOUS | Status: DC
  Administered 2015-08-15 (×3): via INTRAVENOUS

## 2015-08-15 MED ORDER — MIDAZOLAM HCL 5 MG/5ML IJ SOLN
INTRAMUSCULAR | Status: DC | PRN
  Administered 2015-08-15 (×2): 1 mg via INTRAVENOUS

## 2015-08-15 MED ORDER — ACETAMINOPHEN 500 MG PO TABS
1000.0000 mg | ORAL_TABLET | Freq: Four times a day (QID) | ORAL | Status: AC
  Administered 2015-08-15 – 2015-08-16 (×4): 1000 mg via ORAL
  Filled 2015-08-15 (×5): qty 2

## 2015-08-15 MED ORDER — ONDANSETRON HCL 4 MG PO TABS: 4.0000 mg | ORAL_TABLET | Freq: Four times a day (QID) | ORAL | Status: DC | PRN

## 2015-08-15 MED ORDER — PHENYLEPHRINE HCL 10 MG/ML IJ SOLN
INTRAMUSCULAR | Status: DC | PRN
  Administered 2015-08-15 (×5): 80 ug via INTRAVENOUS

## 2015-08-15 MED ORDER — ENOXAPARIN SODIUM 40 MG/0.4ML ~~LOC~~ SOLN: 40.0000 mg | Freq: Every day | SUBCUTANEOUS | Status: DC

## 2015-08-15 MED ORDER — BACLOFEN 10 MG PO TABS: 10.0000 mg | ORAL_TABLET | Freq: Three times a day (TID) | ORAL | Status: DC | PRN

## 2015-08-15 MED ORDER — SODIUM CHLORIDE 0.9 % IR SOLN
Status: DC | PRN
  Administered 2015-08-15: 1000 mL

## 2015-08-15 MED ORDER — PHENYLEPHRINE HCL 10 MG/ML IJ SOLN
10.0000 mg | INTRAVENOUS | Status: DC | PRN
  Administered 2015-08-15: 25 ug/min via INTRAVENOUS

## 2015-08-15 MED ORDER — HYDROMORPHONE HCL 1 MG/ML IJ SOLN
INTRAMUSCULAR | Status: AC
  Filled 2015-08-15: qty 1

## 2015-08-15 MED ORDER — HYDROMORPHONE HCL 1 MG/ML IJ SOLN
0.2500 mg | INTRAMUSCULAR | Status: DC | PRN
  Administered 2015-08-15 (×3): 0.5 mg via INTRAVENOUS

## 2015-08-15 MED ORDER — GABAPENTIN 300 MG PO CAPS
600.0000 mg | ORAL_CAPSULE | Freq: Two times a day (BID) | ORAL | Status: DC
Start: 2015-08-15 — End: 2015-08-18
  Administered 2015-08-15 – 2015-08-18 (×6): 600 mg via ORAL
  Filled 2015-08-15 (×6): qty 2

## 2015-08-15 MED ORDER — METOCLOPRAMIDE HCL 5 MG PO TABS: 5.0000 mg | ORAL_TABLET | Freq: Three times a day (TID) | ORAL | Status: DC | PRN

## 2015-08-15 MED ORDER — SENNOSIDES-DOCUSATE SODIUM 8.6-50 MG PO TABS: 1.0000 | ORAL_TABLET | Freq: Every evening | ORAL | Status: AC | PRN

## 2015-08-15 MED ORDER — FENTANYL CITRATE (PF) 250 MCG/5ML IJ SOLN
INTRAMUSCULAR | Status: AC
  Filled 2015-08-15: qty 5

## 2015-08-15 MED ORDER — MIDAZOLAM HCL 2 MG/2ML IJ SOLN
INTRAMUSCULAR | Status: AC
  Filled 2015-08-15: qty 2

## 2015-08-15 MED ORDER — OXYCODONE HCL 5 MG PO TABS: 5.0000 mg | ORAL_TABLET | ORAL | Status: DC | PRN

## 2015-08-15 MED ORDER — ONDANSETRON HCL 4 MG/2ML IJ SOLN: 4.0000 mg | Freq: Four times a day (QID) | INTRAMUSCULAR | Status: DC | PRN

## 2015-08-15 MED ORDER — OXYCODONE HCL ER 10 MG PO T12A
10.0000 mg | EXTENDED_RELEASE_TABLET | Freq: Two times a day (BID) | ORAL | Status: DC
  Administered 2015-08-15 – 2015-08-18 (×6): 10 mg via ORAL
  Filled 2015-08-15 (×6): qty 1

## 2015-08-15 MED ORDER — ONDANSETRON HCL 4 MG/2ML IJ SOLN
INTRAMUSCULAR | Status: DC | PRN
  Administered 2015-08-15: 4 mg via INTRAVENOUS

## 2015-08-15 MED ORDER — OXYCODONE HCL 5 MG PO TABS
5.0000 mg | ORAL_TABLET | ORAL | Status: DC | PRN
  Administered 2015-08-16 (×2): 10 mg via ORAL
  Administered 2015-08-16: 5 mg via ORAL
  Administered 2015-08-16 – 2015-08-18 (×6): 10 mg via ORAL
  Administered 2015-08-18: 15 mg via ORAL
  Filled 2015-08-15: qty 3
  Filled 2015-08-15 (×2): qty 2
  Filled 2015-08-15: qty 3
  Filled 2015-08-15: qty 2
  Filled 2015-08-15: qty 1
  Filled 2015-08-15 (×4): qty 2

## 2015-08-15 MED ORDER — ACETAMINOPHEN 325 MG PO TABS
650.0000 mg | ORAL_TABLET | Freq: Four times a day (QID) | ORAL | Status: DC | PRN
  Administered 2015-08-16 – 2015-08-17 (×2): 650 mg via ORAL
  Filled 2015-08-15 (×3): qty 2

## 2015-08-15 MED ORDER — PROMETHAZINE HCL 25 MG/ML IJ SOLN: 6.2500 mg | INTRAMUSCULAR | Status: DC | PRN

## 2015-08-15 MED ORDER — MORPHINE SULFATE (PF) 2 MG/ML IV SOLN
1.0000 mg | INTRAVENOUS | Status: DC | PRN
  Administered 2015-08-15 – 2015-08-16 (×2): 1 mg via INTRAVENOUS
  Filled 2015-08-15 (×2): qty 1

## 2015-08-15 MED ORDER — PHENOL 1.4 % MT LIQD: 1.0000 | OROMUCOSAL | Status: DC | PRN

## 2015-08-15 MED ORDER — SODIUM CHLORIDE 0.9 % IV SOLN
INTRAVENOUS | Status: DC
  Administered 2015-08-15: 21:00:00 via INTRAVENOUS

## 2015-08-15 MED ORDER — ROPINIROLE HCL 1 MG PO TABS
2.0000 mg | ORAL_TABLET | Freq: Every day | ORAL | Status: DC
  Administered 2015-08-15 – 2015-08-17 (×3): 2 mg via ORAL
  Filled 2015-08-15 (×3): qty 2

## 2015-08-15 MED ORDER — ENOXAPARIN SODIUM 40 MG/0.4ML ~~LOC~~ SOLN
40.0000 mg | SUBCUTANEOUS | Status: DC
  Administered 2015-08-16 – 2015-08-18 (×3): 40 mg via SUBCUTANEOUS
  Filled 2015-08-15 (×3): qty 0.4

## 2015-08-15 MED ORDER — BUDESONIDE-FORMOTEROL FUMARATE 160-4.5 MCG/ACT IN AERO
2.0000 | INHALATION_SPRAY | Freq: Two times a day (BID) | RESPIRATORY_TRACT | Status: DC
  Administered 2015-08-16 – 2015-08-18 (×5): 2 via RESPIRATORY_TRACT
  Filled 2015-08-15: qty 6

## 2015-08-15 MED ORDER — ALUM & MAG HYDROXIDE-SIMETH 200-200-20 MG/5ML PO SUSP: 30.0000 mL | ORAL | Status: DC | PRN

## 2015-08-15 MED ORDER — 0.9 % SODIUM CHLORIDE (POUR BTL) OPTIME
TOPICAL | Status: DC | PRN
  Administered 2015-08-15: 1000 mL

## 2015-08-15 MED ORDER — MEPERIDINE HCL 25 MG/ML IJ SOLN
6.2500 mg | INTRAMUSCULAR | Status: DC | PRN
  Administered 2015-08-15: 12.5 mg via INTRAVENOUS

## 2015-08-15 MED ORDER — OXYCODONE HCL ER 10 MG PO T12A: 10.0000 mg | EXTENDED_RELEASE_TABLET | Freq: Two times a day (BID) | ORAL | Status: DC

## 2015-08-15 MED ORDER — MENTHOL 3 MG MT LOZG: 1.0000 | LOZENGE | OROMUCOSAL | Status: DC | PRN

## 2015-08-15 MED ORDER — AMITRIPTYLINE HCL 25 MG PO TABS
25.0000 mg | ORAL_TABLET | Freq: Every day | ORAL | Status: DC
Start: 2015-08-15 — End: 2015-08-18
  Administered 2015-08-15 – 2015-08-17 (×3): 25 mg via ORAL
  Filled 2015-08-15 (×3): qty 1

## 2015-08-15 MED ORDER — DIPHENHYDRAMINE HCL 12.5 MG/5ML PO ELIX: 25.0000 mg | ORAL_SOLUTION | ORAL | Status: DC | PRN

## 2015-08-15 MED ORDER — CEFAZOLIN SODIUM-DEXTROSE 2-3 GM-% IV SOLR
2.0000 g | Freq: Four times a day (QID) | INTRAVENOUS | Status: AC
  Administered 2015-08-15 – 2015-08-16 (×2): 2 g via INTRAVENOUS
  Filled 2015-08-15 (×2): qty 50

## 2015-08-15 MED ORDER — ACETAMINOPHEN 650 MG RE SUPP: 650.0000 mg | Freq: Four times a day (QID) | RECTAL | Status: DC | PRN

## 2015-08-15 SURGICAL SUPPLY — 54 items
BLADE SAW SGTL 18X1.27X75 (BLADE) ×2 IMPLANT
BLADE SAW SGTL 18X1.27X75MM (BLADE) ×1
BNDG COHESIVE 6X5 TAN STRL LF (GAUZE/BANDAGES/DRESSINGS) ×3 IMPLANT
CAPT HIP TOTAL 2 ×3 IMPLANT
CELLS DAT CNTRL 66122 CELL SVR (MISCELLANEOUS) ×1 IMPLANT
COVER SURGICAL LIGHT HANDLE (MISCELLANEOUS) ×3 IMPLANT
DRAPE C-ARM 42X72 X-RAY (DRAPES) ×3 IMPLANT
DRAPE IMP U-DRAPE 54X76 (DRAPES) ×3 IMPLANT
DRAPE STERI IOBAN 125X83 (DRAPES) ×3 IMPLANT
DRAPE U-SHAPE 47X51 STRL (DRAPES) ×9 IMPLANT
DRSG AQUACEL AG ADV 3.5X10 (GAUZE/BANDAGES/DRESSINGS) ×3 IMPLANT
DRSG MEPILEX BORDER 4X8 (GAUZE/BANDAGES/DRESSINGS) ×3 IMPLANT
DURAPREP 26ML APPLICATOR (WOUND CARE) ×3 IMPLANT
ELECT BLADE 4.0 EZ CLEAN MEGAD (MISCELLANEOUS) ×3
ELECT REM PT RETURN 9FT ADLT (ELECTROSURGICAL) ×3
ELECTRODE BLDE 4.0 EZ CLN MEGD (MISCELLANEOUS) ×1 IMPLANT
ELECTRODE REM PT RTRN 9FT ADLT (ELECTROSURGICAL) ×1 IMPLANT
FACESHIELD WRAPAROUND (MASK) ×3 IMPLANT
GLOVE NEODERM STRL 7.5 LF PF (GLOVE) ×2 IMPLANT
GLOVE SURG NEODERM 7.5  LF PF (GLOVE) ×4
GLOVE SURG SYN 7.5  E (GLOVE) ×2
GLOVE SURG SYN 7.5 E (GLOVE) ×1 IMPLANT
GOWN SRG XL XLNG 56XLVL 4 (GOWN DISPOSABLE) ×1 IMPLANT
GOWN STRL NON-REIN XL XLG LVL4 (GOWN DISPOSABLE) ×2
GOWN STRL REUS W/ TWL LRG LVL3 (GOWN DISPOSABLE) ×3 IMPLANT
GOWN STRL REUS W/TWL LRG LVL3 (GOWN DISPOSABLE) ×6
HANDPIECE INTERPULSE COAX TIP (DISPOSABLE) ×2
HOOD W/PEELAWAY (MISCELLANEOUS) ×3 IMPLANT
KIT BASIN OR (CUSTOM PROCEDURE TRAY) ×3 IMPLANT
LIGHT WAVEGUIDE WIDE FLAT (MISCELLANEOUS) ×3 IMPLANT
MARKER SKIN DUAL TIP RULER LAB (MISCELLANEOUS) ×3 IMPLANT
PACK TOTAL JOINT (CUSTOM PROCEDURE TRAY) ×3 IMPLANT
PACK UNIVERSAL I (CUSTOM PROCEDURE TRAY) ×3 IMPLANT
PADDING CAST COTTON 6X4 STRL (CAST SUPPLIES) ×3 IMPLANT
PIN SECTOR W/GRIP ACE CUP 52MM (Hips) ×3 IMPLANT
RETRACTOR YANK SUCT EIGR SABER (INSTRUMENTS) ×3 IMPLANT
RTRCTR WOUND ALEXIS 18CM MED (MISCELLANEOUS) ×3
SEALER BIPOLAR AQUA 6.0 (INSTRUMENTS) ×3 IMPLANT
SET HNDPC FAN SPRY TIP SCT (DISPOSABLE) ×1 IMPLANT
SOLUTION BETADINE 4OZ (MISCELLANEOUS) ×3 IMPLANT
SUT ETHIBOND 2 V 37 (SUTURE) ×3 IMPLANT
SUT ETHIBOND NAB CT1 #1 30IN (SUTURE) ×9 IMPLANT
SUT ETHILON 3 0 FSL (SUTURE) ×3 IMPLANT
SUT VIC AB 0 CT1 27 (SUTURE) ×2
SUT VIC AB 0 CT1 27XBRD ANBCTR (SUTURE) ×1 IMPLANT
SUT VIC AB 1 CT1 27 (SUTURE) ×2
SUT VIC AB 1 CT1 27XBRD ANBCTR (SUTURE) ×1 IMPLANT
SUT VIC AB 2-0 CT1 27 (SUTURE) ×4
SUT VIC AB 2-0 CT1 TAPERPNT 27 (SUTURE) ×2 IMPLANT
SYR 20CC LL (SYRINGE) ×3 IMPLANT
TOWEL OR 17X26 10 PK STRL BLUE (TOWEL DISPOSABLE) ×3 IMPLANT
TRAY FOLEY CATH 16FR SILVER (SET/KITS/TRAYS/PACK) ×3 IMPLANT
TUBE CONNECTING 12'X1/4 (SUCTIONS) ×1
TUBE CONNECTING 12X1/4 (SUCTIONS) ×2 IMPLANT

## 2015-08-15 NOTE — Transfer of Care (Signed)
Immediate Anesthesia Transfer of Care Note  Patient: Jenna Stanley  Procedure(s) Performed: Procedure(s): LEFT TOTAL HIP ARTHROPLASTY ANTERIOR APPROACH (Left)  Patient Location: PACU  Anesthesia Type:MAC and Spinal  Level of Consciousness: awake, alert  and oriented  Airway & Oxygen Therapy: Patient Spontanous Breathing  Post-op Assessment: Report given to RN and Post -op Vital signs reviewed and stable  Post vital signs: Reviewed and stable  Last Vitals:  Filed Vitals:   08/15/15 1044  BP: 140/75  Pulse: 84  Temp: 36.9 C  Resp: 18    Complications: No apparent anesthesia complications

## 2015-08-15 NOTE — Anesthesia Preprocedure Evaluation (Addendum)
Anesthesia Evaluation  Patient identified by MRN, date of birth, ID band Patient awake    Reviewed: Allergy & Precautions, NPO status , Patient's Chart, lab work & pertinent test results  Airway Mallampati: II  TM Distance: >3 FB Neck ROM: Full    Dental no notable dental hx.    Pulmonary shortness of breath, COPDCurrent Smoker,  breath sounds clear to auscultation  Pulmonary exam normal       Cardiovascular hypertension, Normal cardiovascular examRhythm:Regular Rate:Normal     Neuro/Psych PSYCHIATRIC DISORDERS  Neuromuscular disease negative neurological ROS     GI/Hepatic negative GI ROS, Neg liver ROS,   Endo/Other  negative endocrine ROS  Renal/GU negative Renal ROS     Musculoskeletal  (+) Arthritis -,   Abdominal   Peds  Hematology negative hematology ROS (+)   Anesthesia Other Findings   Reproductive/Obstetrics negative OB ROS                            Anesthesia Physical Anesthesia Plan  ASA: III  Anesthesia Plan: MAC and Spinal   Post-op Pain Management:    Induction: Intravenous  Airway Management Planned:   Additional Equipment:   Intra-op Plan:   Post-operative Plan:   Informed Consent: I have reviewed the patients History and Physical, chart, labs and discussed the procedure including the risks, benefits and alternatives for the proposed anesthesia with the patient or authorized representative who has indicated his/her understanding and acceptance.   Dental advisory given  Plan Discussed with: CRNA  Anesthesia Plan Comments:         Anesthesia Quick Evaluation

## 2015-08-15 NOTE — H&P (Signed)
PREOPERATIVE H&P  Chief Complaint: left hip degenerative joint disease  HPI: Jenna Stanley is a 69 y.o. female who presents for surgical treatment of left hip degenerative joint disease.  She denies any changes in medical history.  Past Medical History  Diagnosis Date  . COPD (chronic obstructive pulmonary disease)   . History of echocardiogram     a.  2-D echo 01/04/13: EF 55-60%, grade 1 diastolic dysfunction, MAC, trivial MR, PASP 40  . Elevated troponin 12/2012    a.  in setting of viral illness (0.35 => < 0.30);   Lexiscan Myoview 01/07/13: No ischemia, EF 68%, normal stress nuclear study with normal wall motion.  . Chronic back pain   . Sciatica   . Pain management   . RLS (restless legs syndrome)   . Bronchitis   . Arthritis    Past Surgical History  Procedure Laterality Date  . Excision of right anterior subcutaneous shoulder mass    . Cheilectomy with first mtp joint implant, left foot    . Excision arthritic tibial sesamoid, left foot     Social History   Social History  . Marital Status: Married    Spouse Name: N/A  . Number of Children: N/A  . Years of Education: N/A   Social History Main Topics  . Smoking status: Current Every Day Smoker -- 0.50 packs/day for 5 years    Types: Cigarettes    Last Attempt to Quit: 05/13/2015  . Smokeless tobacco: Not on file  . Alcohol Use: No     Comment: QUIT THAT TOO  . Drug Use: No     Comment: CLEAN FOR 27 YRS --  TOOK EVERYTHING  . Sexual Activity: Not on file   Other Topics Concern  . Not on file   Social History Narrative   History of tobacco use, alcohol abuse, heroin, cocaine, and marijuana use. Quit drug use in 1989. Works part-time as Advertising account executive.   Family History  Problem Relation Age of Onset  . Breast cancer Sister   . Breast cancer Sister   . Bladder Cancer Father    Allergies  Allergen Reactions  . Nsaids Nausea Only   Prior to Admission medications   Medication Sig Start Date End Date  Taking? Authorizing Provider  amitriptyline (ELAVIL) 25 MG tablet Take 25 mg by mouth at bedtime.   Yes Historical Provider, MD  budesonide-formoterol (SYMBICORT) 160-4.5 MCG/ACT inhaler Inhale 2 puffs into the lungs 2 (two) times daily.   Yes Historical Provider, MD  diclofenac sodium (VOLTAREN) 1 % GEL Apply 2 gm to painful joints QID prn (No more than 32 gm total per day) 02/01/15  Yes Historical Provider, MD  gabapentin (NEURONTIN) 300 MG capsule Take 600 mg by mouth 2 (two) times daily.    Yes Historical Provider, MD  oxyCODONE-acetaminophen (PERCOCET) 7.5-325 MG per tablet Take 1 tablet by mouth every 6 (six) hours as needed for moderate pain or severe pain.  12/29/12  Yes Historical Provider, MD  rOPINIRole (REQUIP) 2 MG tablet Take 2 mg by mouth at bedtime.   Yes Historical Provider, MD     Positive ROS: All other systems have been reviewed and were otherwise negative with the exception of those mentioned in the HPI and as above.  Physical Exam: General: Alert, no acute distress Cardiovascular: No pedal edema Respiratory: No cyanosis, no use of accessory musculature GI: abdomen soft Skin: No lesions in the area of chief complaint Neurologic: Sensation intact distally Psychiatric:  Patient is competent for consent with normal mood and affect Lymphatic: no lymphedema  MUSCULOSKELETAL: exam stable  Assessment: left hip degenerative joint disease  Plan: Plan for Procedure(s): LEFT TOTAL HIP ARTHROPLASTY ANTERIOR APPROACH  The risks benefits and alternatives were discussed with the patient including but not limited to the risks of nonoperative treatment, versus surgical intervention including infection, bleeding, nerve injury,  blood clots, cardiopulmonary complications, morbidity, mortality, among others, and they were willing to proceed.   Cheral Almas, MD   08/15/2015 7:22 AM

## 2015-08-15 NOTE — Anesthesia Procedure Notes (Signed)
Spinal Patient location during procedure: OR Staffing Anesthesiologist: Nolon Nations Performed by: anesthesiologist  Preanesthetic Checklist Completed: patient identified, site marked, surgical consent, pre-op evaluation, timeout performed, IV checked, risks and benefits discussed and monitors and equipment checked Spinal Block Patient position: sitting Prep: ChloraPrep and site prepped and draped Patient monitoring: heart rate, continuous pulse ox and blood pressure Approach: right paramedian Location: L2-3 Injection technique: single-shot Needle Needle type: Sprotte  Needle gauge: 24 G Needle length: 9 cm Additional Notes Expiration date of kit checked and confirmed. Patient tolerated procedure well, without complications.

## 2015-08-15 NOTE — Discharge Instructions (Signed)

## 2015-08-15 NOTE — Progress Notes (Signed)
Utilization review completed.  

## 2015-08-15 NOTE — Op Note (Signed)
LEFT TOTAL HIP ARTHROPLASTY ANTERIOR APPROACH  Procedure Note AVONNE BERKERY   [id number]  Pre-op Diagnosis: left hip degenerative joint disease     Post-op Diagnosis: same   Operative Procedures  1. Total hip replacement; Left hip; uncemented [id number]   Personnel  Surgeon(s): Tarry Kos, MD   Anesthesia: spinal  Prosthesis: Depuy Acetabulum: Pinnacle 56 mm Femur: Corail KA 13 Head: 36 mm size: +5 Bearing Type: Ceramic on poly  Date of Service: 08/15/2015  Total Hip Arthroplasty (Anterior Approach) Op Note:  After informed consent was obtained and the operative extremity marked in the holding area, the patient was brought back to the operating room and placed supine on the HANA table. Next, the operative extremity was prepped and draped in normal sterile fashion. Surgical timeout occurred verifying patient identification, surgical site, surgical procedure and administration of antibiotics.  A modified anterior Smith-Peterson approach to the hip was performed, using the interval between tensor fascia lata and sartorius.  Dissection was carried bluntly down onto the anterior hip capsule. The lateral femoral circumflex vessels were identified and coagulated. A capsulotomy was performed and the capsular flaps tagged for later repair.  Fluoroscopy was utilized to prepare for the femoral neck cut. The neck osteotomy was performed. The femoral head was removed, the acetabular rim was cleared of soft tissue and attention was turned to reaming the acetabulum.  Sequential reaming was performed under fluoroscopic guidance. We reamed to a size 56 mm, and then impacted the acetabular shell. The liner was then placed after irrigation and attention turned to the femur.  After placing the femoral hook, the leg was taken to externally rotated, extended and adducted position taking care to perform soft tissue releases to allow for adequate mobilization of the femur. Soft tissue was cleared from the  shoulder of the greater trochanter and the hook elevator used to improve exposure of the proximal femur. Sequential broaching performed up to a size 13. Trial neck and head were placed. The leg was brought back up to neutral and the construct reduced. The position and sizing of components, offset and leg lengths were checked using fluoroscopy. Stability of the  construct was checked in extension and external rotation without any subluxation or impingement of prosthesis. We dislocated the prosthesis, dropped the leg back into position, removed trial components, and irrigated copiously. The final stem and head was then placed, the leg brought back up, the system reduced and fluoroscopy used to verify positioning.  We irrigated, obtained hemostasis and closed the capsule using #2 ethibond suture.  Dilute betadyne solution was used. The fascia was closed with #1 vicryl plus, the deep fat layer was closed with 0 vicryl, the subcutaneous layers closed with 2.0 Vicryl Plus and the skin closed with 2.0 nylon and dermabond. A sterile dressing was applied. The patient was awakened in the operating room and taken to recovery in stable condition.  All sponge, needle, and instrument counts were correct at the end of the case.   Position: supine  Complications: none.  Time Out: performed   Drains/Packing: none  Estimated blood loss: 200 cc  Returned to Recovery Room: in good condition.   Antibiotics: yes   Mechanical VTE (DVT) Prophylaxis: sequential compression devices, TED thigh-high  Chemical VTE (DVT) Prophylaxis: aspirin   Fluid Replacement: see anesthesia record  Specimens Removed: 1 to pathology   Sponge and Instrument Count Correct? yes   PACU: portable radiograph - low AP   Admission: inpatient status, start PT & OT  POD#1  Plan/RTC: Return in 2 weeks for staple removal. Return in 6 weeks to see MD.  Weight Bearing/Load Lower Extremity: full  Hip precautions: none Suture Removal: 10-14  days  Betadine to incision twice daily once dressing is removed on POD#7  N. Glee Arvin, MD [facility] [phone] 3:14 PM      Implant Name Type Inv. Item Serial No. Manufacturer Lot No. LRB No. Used  PIN SECTOR W/GRIP ACE CUP - [id number] Hips PIN SECTOR W/GRIP ACE CUP  DEPUY [id number] Left 1  PINN SECTOR W/GRIP ACE CUP 56 - [id number] Hips PINN SECTOR W/GRIP ACE CUP 56  DEPUY [id number] Left 1  APEX HOLE ELIM DEPUY - [id number] Hips APEX HOLE ELIM DEPUY  DEPUY [id number] Left 1  LINER NEUTRAL 52MMX36MMX56N - [id number] Liner LINER NEUTRAL 52MMX36MMX56N  DEPUY [id number] Left 1  SCREW - [id number] Screw SCREW  DEPUY [id number] Left 1  SCREW 6.5MMX30MM - [id number] Screw SCREW 6.5MMX30MM  DEPUY [id number] Left 1  SCREW 6.5MMX30MM - [id number] Screw SCREW 6.5MMX30MM  DEPUY [id number] Left 1  STEM CORAIL KA13 - [id number] Stem STEM CORAIL KA13  DEPUY [id number] Left 1  HEAD CERAMIC 36 PLUS5 - [id number] Hips HEAD CERAMIC 36 PLUS5   DEPUY [id number] Left 1

## 2015-08-15 NOTE — Anesthesia Postprocedure Evaluation (Signed)
Anesthesia Post Note  Patient: Jenna Stanley  Procedure(s) Performed: Procedure(s) (LRB): LEFT TOTAL HIP ARTHROPLASTY ANTERIOR APPROACH (Left)  Anesthesia type: Spinal  Patient location: PACU  Post pain: Pain level controlled  Post assessment: Post-op Vital signs reviewed  Last Vitals: BP 138/57 mmHg  Pulse 86  Temp(Src) 37.2 C (Oral)  Resp 13  Wt 146 lb 1.6 oz (66.271 kg)  SpO2 96%  Post vital signs: Reviewed  Level of consciousness: sedated  Complications: No apparent anesthesia complications

## 2015-08-16 ENCOUNTER — Encounter (HOSPITAL_COMMUNITY): Payer: Self-pay | Admitting: Orthopaedic Surgery

## 2015-08-16 LAB — BASIC METABOLIC PANEL
ANION GAP: 5 (ref 5–15)
BUN: 8 mg/dL (ref 6–20)
CALCIUM: 9 mg/dL (ref 8.9–10.3)
CO2: 27 mmol/L (ref 22–32)
CREATININE: 0.81 mg/dL (ref 0.44–1.00)
Chloride: 106 mmol/L (ref 101–111)
Glucose, Bld: 109 mg/dL — ABNORMAL HIGH (ref 65–99)
Potassium: 4 mmol/L (ref 3.5–5.1)
SODIUM: 138 mmol/L (ref 135–145)

## 2015-08-16 LAB — CBC
HEMATOCRIT: 31 % — AB (ref 36.0–46.0)
Hemoglobin: 10.2 g/dL — ABNORMAL LOW (ref 12.0–15.0)
MCH: 31.2 pg (ref 26.0–34.0)
MCHC: 32.9 g/dL (ref 30.0–36.0)
MCV: 94.8 fL (ref 78.0–100.0)
PLATELETS: 267 10*3/uL (ref 150–400)
RBC: 3.27 MIL/uL — ABNORMAL LOW (ref 3.87–5.11)
RDW: 13.3 % (ref 11.5–15.5)
WBC: 6.6 10*3/uL (ref 4.0–10.5)

## 2015-08-16 MED ORDER — ENSURE ENLIVE PO LIQD
237.0000 mL | Freq: Two times a day (BID) | ORAL | Status: DC
  Administered 2015-08-17 – 2015-08-18 (×3): 237 mL via ORAL

## 2015-08-16 NOTE — Progress Notes (Signed)
   Subjective:  Patient reports pain as mild.  No events.  Objective:   VITALS:   Filed Vitals:   08/15/15 1855 08/15/15 2052 08/16/15 0154 08/16/15 0508  BP: 165/73 138/57 133/68 134/69  Pulse: 74 86 96 85  Temp:  98.9 F (37.2 C) 100.2 F (37.9 C) 98.9 F (37.2 C)  TempSrc:      Resp: Weight:      SpO2: 98% 96% 96% 100%    Neurologically intact Neurovascular intact Sensation intact distally Intact pulses distally Dorsiflexion/Plantar flexion intact Incision: dressing C/D/I and no drainage No cellulitis present Compartment soft   Lab Results  Component Value Date   WBC 11.3* 08/15/2015   HGB 10.5* 08/15/2015   HCT 32.0* 08/15/2015   MCV 95.2 08/15/2015   PLT 290 08/15/2015     Assessment/Plan:  1 Day Post-Op   - Expected postop acute blood loss anemia - will monitor for symptoms - Up with PT/OT - DVT ppx - SCDs, ambulation, lovenox - WBAT operative extremity - foley out this am - Pain control - Discharge planning  Cheral Almas 08/16/2015, 7:37 AM [phone]

## 2015-08-16 NOTE — Evaluation (Signed)
Occupational Therapy Evaluation Patient Details Name: Jenna Stanley MRN: [id number] DOB: 10/20/1946 Today's Date: 08/16/2015    History of Present Illness LEFT TOTAL HIP ARTHROPLASTY ANTERIOR APPROACH    Clinical Impression   Patient presenting with decreased ADL, IADL, functional mobility independence secondary to above. Patient independent >mod I PTA. Patient currently requires up to mod assist with ADLs and functional mobility. Patient will benefit from acute OT to increase overall independence in the areas of ADLs, functional mobility, and overall safety in order to safely discharge home with assistance from daughter.     Follow Up Recommendations  No OT follow up;Supervision/Assistance - 24 hour    Equipment Recommendations  3 in 1 bedside comode;Other (comment) (AE - reacher, sock aid, LH sponge)    Recommendations for Other Services  None at this time   Precautions / Restrictions Precautions Precautions: Fall Precaution Comments: no hip precautions Restrictions Weight Bearing Restrictions: Yes LLE Weight Bearing: Weight bearing as tolerated    Mobility Bed Mobility Overal bed mobility: Needs Assistance Bed Mobility: Supine to Sit     Supine to sit: Mod assist     General bed mobility comments: Pt found seated in recliner upon OT entering/exiting room  Transfers Overall transfer level: Needs assistance Equipment used: Rolling walker (2 wheeled) Transfers: Sit to/from Stand Sit to Stand: Min assist General transfer comment: cues for hand placement and weight bearing.     Balance Overall balance assessment: Needs assistance Sitting-balance support: No upper extremity supported;Feet supported Sitting balance-Leahy Scale: Fair     Standing balance support: Bilateral upper extremity supported;During functional activity Standing balance-Leahy Scale: Poor    ADL Overall ADL's : Needs assistance/impaired   General ADL Comments: Pt unable to reach BLEs for LB  ADLs. Pt may benefit from use of AE to increase independence with this. Pt min>mod assist for functional mobility and transfers.     Pertinent Vitals/Pain Pain Assessment: 0-10 Pain Score: 6  Pain Location: left hip  Pain Descriptors / Indicators: Aching Pain Intervention(s): Limited activity within patient's tolerance;Monitored during session;Repositioned     Hand Dominance Right   Extremity/Trunk Assessment Upper Extremity Assessment Upper Extremity Assessment: Overall WFL for tasks assessed   Lower Extremity Assessment Lower Extremity Assessment: Defer to PT evaluation LLE Deficits / Details: needing assistance to move LLE with bed mobility   Cervical / Trunk Assessment Cervical / Trunk Assessment: Normal   Communication Communication Communication: No difficulties   Cognition Arousal/Alertness: Awake/alert Behavior During Therapy: WFL for tasks assessed/performed Overall Cognitive Status: Within Functional Limits for tasks assessed              Home Living Family/patient expects to be discharged to:: Private residence Living Arrangements: Children (daughter) Available Help at Discharge: Family;Available 24 hours/day (for the next week) Type of Home: House Home Access: Stairs to enter [employer] of Steps: 4 Entrance Stairs-Rails: Right Home Layout: One level     Bathroom Shower/Tub: Walk-in shower;Door   [employer] Toilet: Standard     Home Equipment: Cane - single point   Prior Functioning/Environment Level of Independence: Independent with assistive device(s)        Comments: was using cane for ambulation    OT Diagnosis: Generalized weakness;Acute pain   OT Problem List: Decreased strength;Decreased range of motion;Decreased activity tolerance;Impaired balance (sitting and/or standing);Decreased safety awareness;Decreased knowledge of use of DME or AE;Decreased knowledge of precautions;Pain   OT Treatment/Interventions: Self-care/ADL  training;Therapeutic exercise;Energy conservation;DME and/or AE instruction;Therapeutic activities;Patient/family education;Balance training    OT Goals(Current  goals can be found in the care plan section) Acute Rehab OT Goals Patient Stated Goal: go home OT Goal Formulation: With patient Time For Goal Achievement: 08/30/15 Potential to Achieve Goals: Good ADL Goals Pt Will Perform Grooming: with modified independence;standing Pt Will Perform Lower Body Bathing: with modified independence;sit to/from stand;with adaptive equipment Pt Will Perform Lower Body Dressing: with modified independence;with adaptive equipment;sit to/from stand Pt Will Transfer to Toilet: with modified independence;ambulating;bedside commode Pt Will Perform Tub/Shower Transfer: Shower transfer;3 in 1;ambulating;rolling walker;with modified independence  OT Frequency: Min 2X/week   Barriers to D/C:   None known at this time, pt reports her daughter can assist for at least 1 week post acute d/c    End of Session Equipment Utilized During Treatment: Rolling walker  Activity Tolerance: Patient tolerated treatment well Patient left: in chair;with call bell/phone within reach   Time: 1440-1452 OT Time Calculation (min): 12 min Charges:  OT General Charges $OT Visit: 1 Procedure OT Evaluation $Initial OT Evaluation Tier I: 1 Procedure  Aragon Scarantino , MS, OTR/L, CLT Pager: [phone]  08/16/2015, 3:37 PM

## 2015-08-16 NOTE — Evaluation (Signed)
Physical Therapy Evaluation Patient Details Name: Jenna Stanley MRN: [id number] DOB: 1946-11-17 Today's Date: 08/16/2015   History of Present Illness  LEFT TOTAL HIP ARTHROPLASTY ANTERIOR APPROACH   Clinical Impression  Pt is s/p THA resulting in the deficits listed below (see PT Problem List). Pt will benefit from skilled PT to increase their independence and safety with mobility to allow discharge to home with assistance. Will need to review HEP, stairs and increase ambulation at following sessions.       Follow Up Recommendations Home health PT;Supervision for mobility/OOB    Equipment Recommendations  Rolling walker with 5" wheels    Recommendations for Other Services       Precautions / Restrictions Precautions Precautions: Fall (no hip precautions) Restrictions Weight Bearing Restrictions: Yes LLE Weight Bearing: Weight bearing as tolerated      Mobility  Bed Mobility Overal bed mobility: Needs Assistance Bed Mobility: Supine to Sit     Supine to sit: Mod assist     General bed mobility comments: assist with LLE and trunk  Transfers Overall transfer level: Needs assistance Equipment used: Rolling walker (2 wheeled) Transfers: Sit to/from Stand Sit to Stand: Min assist         General transfer comment: cues for hand placement and weight bearing.   Ambulation/Gait Ambulation/Gait assistance: Min assist Ambulation Distance (Feet): 30 Feet Assistive device: Rolling walker (2 wheeled) Gait Pattern/deviations: Step-to pattern;Decreased step length - left;Decreased weight shift to left;Decreased stance time - left Gait velocity: decreased   General Gait Details: slow and guarded pattern, improving with distance  Stairs            Wheelchair Mobility    Modified Rankin (Stroke Patients Only)       Balance Overall balance assessment: Needs assistance Sitting-balance support: No upper extremity supported Sitting balance-Leahy Scale: Fair      Standing balance support: Bilateral upper extremity supported Standing balance-Leahy Scale: Poor                               Pertinent Vitals/Pain Pain Assessment: 0-10 Pain Score: 8  Pain Location: Lt hip and thigh Pain Descriptors / Indicators: Sharp;Shooting Pain Intervention(s): Monitored during session;Repositioned    Home Living Family/patient expects to be discharged to:: Private residence Living Arrangements: Children Available Help at Discharge: Family Type of Home: House Home Access: Stairs to enter Entrance Stairs-Rails: Right Entrance Stairs-Number of Steps: 4 Home Layout: One level Home Equipment: None;Cane - single point      Prior Function Level of Independence: Independent with assistive device(s)         Comments: was using cane for ambulation     Hand Dominance        Extremity/Trunk Assessment               Lower Extremity Assessment: LLE deficits/detail   LLE Deficits / Details: needing assistance to move LLE with bed mobility     Communication   Communication: No difficulties  Cognition Arousal/Alertness: Awake/alert Behavior During Therapy: WFL for tasks assessed/performed Overall Cognitive Status: Within Functional Limits for tasks assessed                      General Comments      Exercises Total Joint Exercises Ankle Circles/Pumps: AROM;Both;15 reps Quad Sets: Strengthening;Left;10 reps Gluteal Sets: Strengthening;Left;10 reps      Assessment/Plan    PT Assessment Patient needs continued PT services  PT Diagnosis Difficulty walking;Acute pain   PT Problem List Decreased strength;Decreased range of motion;Decreased activity tolerance;Decreased balance;Decreased mobility;Decreased knowledge of use of DME;Pain  PT Treatment Interventions DME instruction;Stair training;Gait training;Functional mobility training;Therapeutic activities;Therapeutic exercise;Patient/family education   PT Goals  (Current goals can be found in the Care Plan section) Acute Rehab PT Goals Patient Stated Goal: go home PT Goal Formulation: With patient Time For Goal Achievement: 08/30/15 Potential to Achieve Goals: Good    Frequency Min 5X/week   Barriers to discharge        Co-evaluation               End of Session Equipment Utilized During Treatment: Gait belt Activity Tolerance: Patient tolerated treatment well Patient left: in chair;with call bell/phone within reach;with family/visitor present Nurse Communication: Mobility status         Time: 1610-9604 PT Time Calculation (min) (ACUTE ONLY): 31 min   Charges:   PT Evaluation $Initial PT Evaluation Tier I: 1 Procedure PT Treatments $Gait Training: 8-22 mins   PT G Codes:        Christiane Ha, PT, CSCS Pager [phone] Office [phone]  08/16/2015, 2:02 PM

## 2015-08-17 LAB — CBC
HCT: 26.5 % — ABNORMAL LOW (ref 36.0–46.0)
Hemoglobin: 8.8 g/dL — ABNORMAL LOW (ref 12.0–15.0)
MCH: 31.2 pg (ref 26.0–34.0)
MCHC: 33.2 g/dL (ref 30.0–36.0)
MCV: 94 fL (ref 78.0–100.0)
PLATELETS: 234 10*3/uL (ref 150–400)
RBC: 2.82 MIL/uL — AB (ref 3.87–5.11)
RDW: 13.7 % (ref 11.5–15.5)
WBC: 10.7 10*3/uL — AB (ref 4.0–10.5)

## 2015-08-17 NOTE — Progress Notes (Signed)
   Subjective:  Patient reports pain as mild.  No events.  Objective:   VITALS:   Filed Vitals:   08/16/15 1935 08/16/15 2100 08/17/15 0629 08/17/15 0947  BP:  94/48 124/56   Pulse:  85 82   Temp:  98.5 F (36.9 C) 98.7 F (37.1 C)   TempSrc:      Resp:  16 16   Weight:      SpO2: 95% 93% 94% 94%    Neurologically intact Neurovascular intact Sensation intact distally Intact pulses distally Dorsiflexion/Plantar flexion intact Incision: dressing C/D/I and no drainage No cellulitis present Compartment soft   Lab Results  Component Value Date   WBC 10.7* 08/17/2015   HGB 8.8* 08/17/2015   HCT 26.5* 08/17/2015   MCV 94.0 08/17/2015   PLT 234 08/17/2015     Assessment/Plan:  2 Days Post-Op   - Expected postop acute blood loss anemia - will monitor for symptoms - Up with PT/OT - DVT ppx - SCDs, ambulation, lovenox - WBAT operative extremity - Pain control - Discharge planning - HHPT  Cheral Almas 08/17/2015, 11:08 AM [phone]

## 2015-08-17 NOTE — Progress Notes (Signed)
Physical Therapy Treatment Patient Details Name: Jenna Stanley MRN: [id number] DOB: January 21, 1946 Today's Date: 08/17/2015    History of Present Illness LEFT TOTAL HIP ARTHROPLASTY ANTERIOR APPROACH     PT Comments    Patient is progressing again this afternoon. Able to complete stair training. She wants to practice steps again in AM prior to returning home.   Follow Up Recommendations  Home health PT;Supervision for mobility/OOB     Equipment Recommendations  Rolling walker with 5" wheels    Recommendations for Other Services       Precautions / Restrictions Precautions Precautions: Fall Precaution Comments: no hip precautions Restrictions Weight Bearing Restrictions: Yes LLE Weight Bearing: Weight bearing as tolerated    Mobility  Bed Mobility Overal bed mobility: Needs Assistance Bed Mobility: Supine to Sit     Supine to sit: Min assist     General bed mobility comments: Patient up in recliner before and after session  Transfers Overall transfer level: Needs assistance Equipment used: Rolling walker (2 wheeled) Transfers: Sit to/from Stand Sit to Stand: Min guard         General transfer comment: cues for hand placement   Ambulation/Gait Ambulation/Gait assistance: Supervision Ambulation Distance (Feet): 250 Feet Assistive device: Rolling walker (2 wheeled) Gait Pattern/deviations: Step-through pattern Gait velocity: Decreased during second session today Gait velocity interpretation: Below normal Zettler for age/gender General Gait Details: Cues for upright posture. No LOB noted. Safe use with RW   Stairs Stairs: Yes Stairs assistance: Min guard Stair Management: Step to pattern;Forwards;Two rails Number of Stairs: 5 General stair comments: MG for safety. Cues for sequency and technique  Wheelchair Mobility    Modified Rankin (Stroke Patients Only)       Balance Overall balance assessment: Needs assistance Sitting-balance support: No upper  extremity supported;Feet supported Sitting balance-Leahy Scale: Fair     Standing balance support: Bilateral upper extremity supported;During functional activity Standing balance-Leahy Scale: Fair                      Cognition Arousal/Alertness: Awake/alert Behavior During Therapy: WFL for tasks assessed/performed Overall Cognitive Status: Within Functional Limits for tasks assessed       Memory:  (Per patient report)              Exercises Total Joint Exercises Heel Slides: AAROM;Left;10 reps Hip ABduction/ADduction: AAROM;Left;10 reps Long Arc Quad: AROM;Left;10 reps    General Comments        Pertinent Vitals/Pain Pain Assessment: 0-10 Pain Score: 6  Pain Location: left hip Pain Descriptors / Indicators: Aching;Sore Pain Intervention(s): Monitored during session    Home Living                      Prior Function            PT Goals (current goals can now be found in the care plan section) Progress towards PT goals: Progressing toward goals    Frequency  7X/week    PT Plan Current plan remains appropriate    Co-evaluation             End of Session   Activity Tolerance: Patient tolerated treatment well Patient left: in chair;with call bell/phone within reach     Time: 1132-1150 PT Time Calculation (min) (ACUTE ONLY): 18 min  Charges:  $Gait Training: 8-22 mins $Therapeutic Exercise: 8-22 mins  G Codes:      Fredrich Birks 08/17/2015, 12:38 PM 08/17/2015 Fredrich Birks PTA [phone] pager [phone] office

## 2015-08-17 NOTE — Progress Notes (Signed)
Occupational Therapy Treatment Patient Details Name: Jenna Stanley MRN: [id number] DOB: 25-Jan-1946 Today's Date: 08/17/2015    History of present illness LEFT TOTAL HIP ARTHROPLASTY ANTERIOR APPROACH    OT comments  Patient progressing towards OT goals, continue plan of care for now. Pt takes extra time to complete tasks and states she has poor short term memory. Pt reports her daughter will be available to assist prn post acute d/c.    Follow Up Recommendations  No OT follow up;Supervision/Assistance - 24 hour    Equipment Recommendations  3 in 1 bedside comode;Other (comment) (AE)    Recommendations for Other Services  None at this time   Precautions / Restrictions Precautions Precautions: Fall Precaution Comments: no hip precautions Restrictions Weight Bearing Restrictions: Yes LLE Weight Bearing: Weight bearing as tolerated    Mobility Bed Mobility - Per PT note Overal bed mobility: Needs Assistance Bed Mobility: Supine to Sit     Supine to sit: Min assist     General bed mobility comments: Min A for R LE and cues for positioning  Transfers Overall transfer level: Needs assistance Equipment used: Rolling walker (2 wheeled) Transfers: Sit to/from Stand Sit to Stand: Min guard General transfer comment: cues for hand placement     Balance Overall balance assessment: Needs assistance Sitting-balance support: No upper extremity supported;Feet supported Sitting balance-Leahy Scale: Fair     Standing balance support: Bilateral upper extremity supported;During functional activity Standing balance-Leahy Scale: Fair   ADL Overall ADL's : Needs assistance/impaired General ADL Comments: Pt unable to reach LLE for LB ADLs. Introduced AE, sock aid and LH sponge; encouraged patient to use this equipment to increase independence with these tasks. Therapist then assisted patient > therapy gym for simulated walk-in shower transfer using block. Pt min guard for shower transfer.  Pt reports her daughter will be available to assist post acute d/c.      Cognition   Behavior During Therapy: WFL for tasks assessed/performed Overall Cognitive Status: Within Functional Limits for tasks assessed       Memory: Decreased short-term memory (pr reports short term memory loss)                  Pertinent Vitals/ Pain       Pain Assessment: 0-10 Pain Score: 8  Pain Location: left hip with movement/mobility Pain Descriptors / Indicators: Aching;Sore Pain Intervention(s): Monitored during session         Frequency Min 2X/week     Progress Toward Goals  OT Goals(current goals can now be found in the care plan section)  Progress towards OT goals: Progressing toward goals     Plan Discharge plan remains appropriate    End of Session Equipment Utilized During Treatment: Rolling walker   Activity Tolerance Patient tolerated treatment well   Patient Left Other (comment) (in therapy gym with PT)    Time: 1116-1130 OT Time Calculation (min): 14 min  Charges: OT General Charges $OT Visit: 1 Procedure OT Treatments $Self Care/Home Management : 8-22 mins  Kanai Hilger , MS, OTR/L, CLT Pager: [phone]  08/17/2015, 12:36 PM

## 2015-08-17 NOTE — Progress Notes (Signed)
Physical Therapy Treatment Patient Details Name: Jenna Stanley MRN: [id number] DOB: 11-28-1946 Today's Date: 08/17/2015    History of Present Illness LEFT TOTAL HIP ARTHROPLASTY ANTERIOR APPROACH     PT Comments    Patient progressing very well with ambulation. Will attempt steps later today.   Follow Up Recommendations  Home health PT;Supervision for mobility/OOB     Equipment Recommendations  Rolling walker with 5" wheels    Recommendations for Other Services       Precautions / Restrictions Precautions Precautions: Fall Precaution Comments: no hip precautions Restrictions LLE Weight Bearing: Weight bearing as tolerated    Mobility  Bed Mobility Overal bed mobility: Needs Assistance Bed Mobility: Supine to Sit     Supine to sit: Min assist     General bed mobility comments: Min A for R LE and cues for positioning  Transfers Overall transfer level: Needs assistance Equipment used: Rolling walker (2 wheeled) Transfers: Sit to/from Stand Sit to Stand: Min guard         General transfer comment: cues for hand placement   Ambulation/Gait Ambulation/Gait assistance: Min guard Ambulation Distance (Feet): 300 Feet Assistive device: Rolling walker (2 wheeled) Gait Pattern/deviations: Step-through pattern Gait velocity: increasing   General Gait Details: Cues for upright posture. Patient improving with step through pattern   Stairs            Wheelchair Mobility    Modified Rankin (Stroke Patients Only)       Balance                                    Cognition Arousal/Alertness: Awake/alert Behavior During Therapy: WFL for tasks assessed/performed Overall Cognitive Status: Within Functional Limits for tasks assessed                      Exercises Total Joint Exercises Heel Slides: AAROM;Left;10 reps Hip ABduction/ADduction: AAROM;Left;10 reps Long Arc Quad: AROM;Left;10 reps    General Comments         Pertinent Vitals/Pain Pain Score: 6  Pain Location: L hip Pain Descriptors / Indicators: Sore;Aching Pain Intervention(s): Monitored during session    Home Living                      Prior Function            PT Goals (current goals can now be found in the care plan section) Progress towards PT goals: Progressing toward goals    Frequency  Min 5X/week    PT Plan Current plan remains appropriate    Co-evaluation             End of Session   Activity Tolerance: Patient tolerated treatment well Patient left: in chair;with call bell/phone within reach     Time: 0755-0822 PT Time Calculation (min) (ACUTE ONLY): 27 min  Charges:  $Gait Training: 8-22 mins $Therapeutic Exercise: 8-22 mins                    G Codes:      Fredrich Birks 08/17/2015, 9:07 AM 08/17/2015 Fredrich Birks PTA [phone] pager [phone] office

## 2015-08-17 NOTE — Progress Notes (Signed)
Nutrition Brief Note  Patient identified on the Malnutrition Screening Tool (MST) Report. No recent weight loss noted. Weight loss ~1-2 years ago, but weight seems to be trending up now.   Wt Readings from Last 15 Encounters:  08/15/15 146 lb 1.6 oz (66.271 kg)  08/03/15 146 lb 1.6 oz (66.271 kg)  06/13/15 140 lb (63.504 kg)  02/18/15 142 lb (64.411 kg)  01/10/13 156 lb (70.761 kg)  01/06/13 155 lb (70.308 kg)  01/03/13 157 lb 3 oz (71.3 kg)  02/21/09 150 lb (68.04 kg)    Body mass index is 22.88 kg/(m^2). Patient meets criteria for normal weight based on current BMI.   Current diet order is regular, patient is consuming approximately 100% of meals at this time. Labs and medications reviewed.   No nutrition interventions warranted at this time. If nutrition issues arise, please consult RD.   Joaquin Courts, RD, LDN, CNSC Pager [phone] After Hours Pager [phone]

## 2015-08-18 LAB — CBC
HCT: 24.5 % — ABNORMAL LOW (ref 36.0–46.0)
HEMOGLOBIN: 8.3 g/dL — AB (ref 12.0–15.0)
MCH: 31.8 pg (ref 26.0–34.0)
MCHC: 33.9 g/dL (ref 30.0–36.0)
MCV: 93.9 fL (ref 78.0–100.0)
Platelets: 225 10*3/uL (ref 150–400)
RBC: 2.61 MIL/uL — ABNORMAL LOW (ref 3.87–5.11)
RDW: 13.8 % (ref 11.5–15.5)
WBC: 10.7 10*3/uL — AB (ref 4.0–10.5)

## 2015-08-18 LAB — TYPE AND SCREEN
ABO/RH(D): O POS
Antibody Screen: NEGATIVE
UNIT DIVISION: 0
Unit division: 0

## 2015-08-18 MED ORDER — ENOXAPARIN SODIUM 40 MG/0.4ML ~~LOC~~ SOLN: 40.0000 mg | SUBCUTANEOUS | Status: AC

## 2015-08-18 NOTE — Care Management Note (Signed)
Case Management Note  Patient Details  Name: Jenna Stanley MRN: [id number] Date of Birth: [birth year]  Subjective/Objective:                  LEFT TOTAL HIP ARTHROPLASTY ANTERIOR APPROACH   Action/Plan:  Discharge planning Expected Discharge Date:  08/18/15              Expected Discharge Plan:  [place]  In-House Referral:     Discharge planning Services  CM Consult  Post Acute Care Choice:  Home Health Choice offered to:  Patient  DME Arranged:  3-N-1, Walker rolling DME Agency:  [place]:  PT [place]:  [place]  Status of Service:  Completed, signed off  Medicare Important Message Given:    Date Medicare IM Given:    Medicare IM give by:    Date Additional Medicare IM Given:    Additional Medicare Important Message give by:     If discussed at [place] of Stay Meetings, dates discussed:    Additional Comments: CM met with pt in room to offer choice of home health agency.  Pt chooses [facility] to render HHPT.  Address and contact information verified by pt.  Referral called to [facility] rep, Tiffany.  CM called [facility] DME rep, Merry Proud to please deliver the rolling walker to room prior discharge.  No other CM needs were communicated. Dellie Catholic, RN 08/18/2015, 12:14 PM

## 2015-08-18 NOTE — Discharge Summary (Signed)
Patient ID: Jenna Stanley MRN: [id number] DOB/AGE: Aug 19, 1946 69 y.o.  Admit date: 08/15/2015 Discharge date: 08/18/2015  Admission Diagnoses:  Active Problems:   Osteoarthritis of left hip   Hip arthritis   Discharge Diagnoses:  S/p left total hip arthroplasty direct anterior approach Post op anemia secondary to surgery asymptomatic    Past Medical History  Diagnosis Date  . COPD (chronic obstructive pulmonary disease)   . History of echocardiogram     a.  2-D echo 01/04/13: EF 55-60%, grade 1 diastolic dysfunction, MAC, trivial MR, PASP 40  . Elevated troponin 12/2012    a.  in setting of viral illness (0.35 => < 0.30);   Lexiscan Myoview 01/07/13: No ischemia, EF 68%, normal stress nuclear study with normal wall motion.  . Sciatica   . Pain management   . RLS (restless legs syndrome)   . Pneumonia "several times"  . Chronic bronchitis     "used to get it q yr; haven't had it in 3-4 years" (08/16/2015)  . Hepatitis C 1990's    "had treatment; it's gone now" (08/16/2015)  . Migraine     hx; went to chiropractor; gone now  . Arthritis     "qwhere"  . Fibromyalgia     hx  . Chronic lower back pain   . Anxiety     Surgeries: Procedure(s): LEFT TOTAL HIP ARTHROPLASTY ANTERIOR APPROACH on 08/15/2015   Consultants:    Discharged Condition: Improved  Hospital Course: TALYA QUAIN is an 69 y.o. female who was admitted 08/15/2015 for operative treatment of<principal problem not specified>. Patient has severe unremitting pain that affects sleep, daily activities, and work/hobbies. After pre-op clearance the patient was taken to the operating room on 08/15/2015 and underwent  Procedure(s): LEFT TOTAL HIP ARTHROPLASTY ANTERIOR APPROACH.    Patient was given perioperative antibiotics: Anti-infectives    Start     Dose/Rate Route Frequency Ordered Stop   08/15/15 2100  ceFAZolin (ANCEF) IVPB 2 g/50 mL premix     2 g 100 mL/hr over 30 Minutes Intravenous Every 6 hours 08/15/15 1913  08/16/15 0414   08/15/15 1200  ceFAZolin (ANCEF) IVPB 2 g/50 mL premix     2 g 100 mL/hr over 30 Minutes Intravenous To ShortStay [facility] 08/14/15 1157 08/15/15 1250       Patient was given sequential compression devices, early ambulation, and chemoprophylaxis to prevent DVT.  Patient benefited maximally from hospital stay and there were no complications.    Recent vital signs: Patient Vitals for the past 24 hrs:  BP Temp Temp src Pulse Resp SpO2  08/18/15 0540 (!) 96/54 mmHg 99 F (37.2 C) Oral 70 16 100 %  08/17/15 2054 (!) 98/52 mmHg 100 F (37.8 C) Oral 89 17 92 %  08/17/15 1929 - - - - - 92 %  08/17/15 0947 - - - - - 94 %     Recent laboratory studies:  Recent Labs  08/15/15 2150 08/16/15 0632 08/17/15 0358 08/18/15 0400  WBC 11.3* 6.6 10.7* 10.7*  HGB 10.5* 10.2* 8.8* 8.3*  HCT 32.0* 31.0* 26.5* 24.5*  PLT 290 267 234 225  NA  --  138  --   --   K  --  4.0  --   --   CL  --  106  --   --   CO2  --  27  --   --   BUN  --  8  --   --   CREATININE 0.77  0.81  --   --   GLUCOSE  --  109*  --   --   CALCIUM  --  9.0  --   --      Discharge Medications:     Medication List    STOP taking these medications        oxyCODONE-acetaminophen 7.5-325 MG per tablet  Commonly known as:  PERCOCET     rOPINIRole 2 MG tablet  Commonly known as:  REQUIP      TAKE these medications        amitriptyline 25 MG tablet  Commonly known as:  ELAVIL  Take 25 mg by mouth at bedtime.     budesonide-formoterol 160-4.5 MCG/ACT inhaler  Commonly known as:  SYMBICORT  Inhale 2 puffs into the lungs 2 (two) times daily.     enoxaparin 40 MG/0.4ML injection  Commonly known as:  LOVENOX  Inject 0.4 mLs (40 mg total) into the skin daily.     gabapentin 300 MG capsule  Commonly known as:  NEURONTIN  Take 600 mg by mouth 2 (two) times daily.     OxyCODONE 10 mg T12a 12 hr tablet  Commonly known as:  OXYCONTIN  Take 1 tablet (10 mg total) by mouth every 12 (twelve) hours.      oxyCODONE 5 MG immediate release tablet  Commonly known as:  Oxy IR/ROXICODONE  Take 1-3 tablets (5-15 mg total) by mouth every 4 (four) hours as needed.     promethazine 25 MG tablet  Commonly known as:  PHENERGAN  Take 1 tablet (25 mg total) by mouth every 6 (six) hours as needed for nausea.     senna-docusate 8.6-50 MG per tablet  Commonly known as:  SENOKOT S  Take 1 tablet by mouth at bedtime as needed.     VOLTAREN 1 % Gel  Generic drug:  diclofenac sodium  Apply 2 gm to painful joints QID prn (No more than 32 gm total per day)        Diagnostic Studies: Dg Pelvis Portable  08/15/2015   CLINICAL DATA:  Osteoarthritis of the left hip. Total hip prosthesis.  EXAM: PORTABLE PELVIS 1-2 VIEWS  COMPARISON:  Radiographs dated 11/04/2015  FINDINGS: Acetabular and femoral components appear in good position. Postsurgical air in the soft tissues around the hip. No fractures.  IMPRESSION: Satisfactory appearance of the left hip in the AP projection after total hip prosthesis insertion.   Electronically Signed   By: Francene Boyers M.D.   On: 08/15/2015 18:29   Dg Pelvis Portable  08/15/2015   CLINICAL DATA:  Left total hip replacement secondary to osteoarthritis.  EXAM: PORTABLE PELVIS 1-2 VIEWS  COMPARISON:  Radiographs dated 02/18/2015  FINDINGS: AP portable view of the left hip demonstrates at the acetabular and femoral components of the total hip prosthesis appear in excellent position in the AP projection. No fractures.  IMPRESSION: Satisfactory appearance of the left hip in the AP projection after total hip prosthesis insertion.   Electronically Signed   By: Francene Boyers M.D.   On: 08/15/2015 17:11   Dg Hip Operative Unilat With Pelvis Left  08/15/2015   CLINICAL DATA:  Anterior approach left hip replacement today. Intraoperative images.  EXAM: OPERATIVE LEFT HIP (WITH PELVIS IF PERFORMED) 2 VIEWS  TECHNIQUE: Fluoroscopic spot image(s) were submitted for interpretation post-operatively.   COMPARISON:  Plain films left hip 02/18/2015.  FINDINGS: Two fluoroscopic intraoperative spot views demonstrate a left hip replacement in place. The device is located.  No fracture is identified.  IMPRESSION: Left hip replacement without evidence of complication.   Electronically Signed   By: Drusilla Kanner M.D.   On: 08/15/2015 15:17    Disposition: 01-Home or Self Care      Discharge Instructions    Weight bearing as tolerated    Complete by:  As directed   No hip precautions.  Laterality:  left  Extremity:  Lower           Follow-up Information    Follow up with Cheral Almas, MD In 2 weeks.   Specialty:  Orthopedic Surgery   Why:  For suture removal, For wound re-check   Contact information:   [address] [place] [address] [phone]        Signed: Richardean Canal 08/18/2015, 9:21 AM

## 2015-08-18 NOTE — Progress Notes (Signed)
Subjective: 3 Days Post-Op Procedure(s) (LRB): LEFT TOTAL HIP ARTHROPLASTY ANTERIOR APPROACH (Left) Patient reports pain as mild just some soreness.  No complaints otherwise wanting to go home today.  Objective: Vital signs in last 24 hours: Temp:  [99 F (37.2 C)-100 F (37.8 C)] 99 F (37.2 C) (08/20 0540) Pulse Rate:  [70-89] 70 (08/20 0540) Resp:  [16-17] 16 (08/20 0540) BP: (96-98)/(52-54) 96/54 mmHg (08/20 0540) SpO2:  [92 %-100 %] 100 % (08/20 0540)  Intake/Output from previous day: 08/19 0701 - 08/20 0700 In: 720 [P.O.:720] Out: 600 [Urine:600] Intake/Output this shift:     Recent Labs  08/15/15 2150 08/16/15 0632 08/17/15 0358 08/18/15 0400  HGB 10.5* 10.2* 8.8* 8.3*    Recent Labs  08/17/15 0358 08/18/15 0400  WBC 10.7* 10.7*  RBC 2.82* 2.61*  HCT 26.5* 24.5*  PLT 234 225    Recent Labs  08/15/15 2150 08/16/15 0632  NA  --  138  K  --  4.0  CL  --  106  CO2  --  27  BUN  --  8  CREATININE 0.77 0.81  GLUCOSE  --  109*  CALCIUM  --  9.0   No results for input(s): LABPT, INR in the last 72 hours.  Sensation intact distally Intact pulses distally Dorsiflexion/Plantar flexion intact Incision: dressing C/D/I Compartment soft  Assessment/Plan: 3 Days Post-Op Procedure(s) (LRB): LEFT TOTAL HIP ARTHROPLASTY ANTERIOR APPROACH (Left) Up with therapy Discharge home with home health  Follow up with Dr. Roda Shutters in 2 weeks.' No symptoms of anemia this AM.Will discharge later today if remains asymptomatic.  Tracker Mance 08/18/2015, 9:12 AM

## 2015-08-18 NOTE — Clinical Social Work Note (Signed)
CSW consult acknowledged:  Clinical Social Worker received consult for SNF placement. PT currently recommending Home Health. Clinical Social Worker will sign off for now as social work intervention is no longer needed. Please consult us again if new need arises.  Shaleena Crusoe, MSW, LCSWA [phone] 08/18/2015 10:14 AM  

## 2015-08-18 NOTE — Progress Notes (Signed)
Physical Therapy Treatment Patient Details Name: Jenna Stanley MRN: [id number] DOB: [birth year] Today's Date: 08/18/2015    History of Present Illness LEFT TOTAL HIP ARTHROPLASTY ANTERIOR APPROACH     PT Comments    Patient agreeable to therapy, stated difficulty with getting in and out of bed, and stairs.  Practice with both areas of concern per notes below.  Overall patient requiring MIN Guard assist for both after practice.  Patient will benefit from  Continued PT services for overall progression, but is able to return home as medically appropriate and continue with Home Health.  Will maintain on PT caseload until DC.  Follow Up Recommendations  Home health PT;Supervision for mobility/OOB     Equipment Recommendations  Rolling walker with 5" wheels    Recommendations for Other Services       Precautions / Restrictions Precautions Precautions: Fall Precaution Comments: no hip precautions Restrictions Weight Bearing Restrictions: Yes LLE Weight Bearing: Weight bearing as tolerated    Mobility  Bed Mobility Overal bed mobility: Needs Assistance Bed Mobility: Supine to Sit     Supine to sit: Min guard;Min assist     General bed mobility comments: Cues for technique, little physical assist. Bed flat and no rail  Transfers Overall transfer level: Needs assistance Equipment used: Rolling walker (2 wheeled) Transfers: Sit to/from Stand Sit to Stand: Min guard         General transfer comment: cues for hand placement   Ambulation/Gait Ambulation/Gait assistance: Supervision Ambulation Distance (Feet): 250 Feet Assistive device: Rolling walker (2 wheeled) Gait Pattern/deviations: Step-through pattern;Antalgic Gait velocity: Decreased Gait velocity interpretation: Below normal [place] for age/gender General Gait Details: Slow pace, increased with activity, safe use of RW.   Stairs Stairs: Yes Stairs assistance: Min guard Stair Management: Two rails;Step to  pattern Number of Stairs: 9 General stair comments: Cues for sequency and technique  Wheelchair Mobility    Modified Rankin (Stroke Patients Only)       Balance     Sitting balance-Leahy Scale: Fair       Standing balance-Leahy Scale: Fair                      Cognition Arousal/Alertness: Awake/alert Behavior During Therapy: WFL for tasks assessed/performed Overall Cognitive Status: Within Functional Limits for tasks assessed                      Exercises Total Joint Exercises Ankle Circles/Pumps: AROM;Both;15 reps Heel Slides: AAROM;Left;10 reps Hip ABduction/ADduction: AAROM;Left;10 reps    General Comments        Pertinent Vitals/Pain Pain Assessment: 0-10 Pain Score: 7  Pain Location: L hip Pain Descriptors / Indicators: Aching;Sore Pain Intervention(s): Monitored during session;Repositioned    Home Living                      Prior Function            PT Goals (current goals can now be found in the care plan section) Acute Rehab PT Goals Patient Stated Goal: go home PT Goal Formulation: With patient Time For Goal Achievement: 08/30/15 Potential to Achieve Goals: Good Progress towards PT goals: Progressing toward goals    Frequency  7X/week    PT Plan Current plan remains appropriate    Co-evaluation             End of Session Equipment Utilized During Treatment: Gait belt Activity Tolerance: Patient tolerated treatment well Patient left:  in bed;with call bell/phone within reach     Time: 0910-0950 PT Time Calculation (min) (ACUTE ONLY): 40 min  Charges:  $Gait Training: 8-22 mins $Therapeutic Activity: 23-37 mins                    G Codes:      Jenna Stanley L 09/03/2015, 11:01 AM

## 2015-08-19 DIAGNOSIS — Z471 Aftercare following joint replacement surgery: Secondary | ICD-10-CM | POA: Diagnosis not present

## 2015-08-19 DIAGNOSIS — Z7901 Long term (current) use of anticoagulants: Secondary | ICD-10-CM | POA: Diagnosis not present

## 2015-08-19 DIAGNOSIS — F17210 Nicotine dependence, cigarettes, uncomplicated: Secondary | ICD-10-CM | POA: Diagnosis not present

## 2015-08-19 DIAGNOSIS — G8929 Other chronic pain: Secondary | ICD-10-CM | POA: Diagnosis not present

## 2015-08-19 DIAGNOSIS — J449 Chronic obstructive pulmonary disease, unspecified: Secondary | ICD-10-CM | POA: Diagnosis not present

## 2015-08-19 DIAGNOSIS — M5442 Lumbago with sciatica, left side: Secondary | ICD-10-CM | POA: Diagnosis not present

## 2015-08-19 DIAGNOSIS — Z96642 Presence of left artificial hip joint: Secondary | ICD-10-CM | POA: Diagnosis not present

## 2015-08-21 DIAGNOSIS — Z96642 Presence of left artificial hip joint: Secondary | ICD-10-CM | POA: Diagnosis not present

## 2015-08-21 DIAGNOSIS — Z7901 Long term (current) use of anticoagulants: Secondary | ICD-10-CM | POA: Diagnosis not present

## 2015-08-21 DIAGNOSIS — M5442 Lumbago with sciatica, left side: Secondary | ICD-10-CM | POA: Diagnosis not present

## 2015-08-21 DIAGNOSIS — J449 Chronic obstructive pulmonary disease, unspecified: Secondary | ICD-10-CM | POA: Diagnosis not present

## 2015-08-21 DIAGNOSIS — G8929 Other chronic pain: Secondary | ICD-10-CM | POA: Diagnosis not present

## 2015-08-21 DIAGNOSIS — Z471 Aftercare following joint replacement surgery: Secondary | ICD-10-CM | POA: Diagnosis not present

## 2015-08-21 DIAGNOSIS — F17210 Nicotine dependence, cigarettes, uncomplicated: Secondary | ICD-10-CM | POA: Diagnosis not present

## 2015-08-22 DIAGNOSIS — Z471 Aftercare following joint replacement surgery: Secondary | ICD-10-CM | POA: Diagnosis not present

## 2015-08-22 DIAGNOSIS — F17210 Nicotine dependence, cigarettes, uncomplicated: Secondary | ICD-10-CM | POA: Diagnosis not present

## 2015-08-22 DIAGNOSIS — M5442 Lumbago with sciatica, left side: Secondary | ICD-10-CM | POA: Diagnosis not present

## 2015-08-22 DIAGNOSIS — Z7901 Long term (current) use of anticoagulants: Secondary | ICD-10-CM | POA: Diagnosis not present

## 2015-08-22 DIAGNOSIS — Z96642 Presence of left artificial hip joint: Secondary | ICD-10-CM | POA: Diagnosis not present

## 2015-08-22 DIAGNOSIS — G8929 Other chronic pain: Secondary | ICD-10-CM | POA: Diagnosis not present

## 2015-08-22 DIAGNOSIS — J449 Chronic obstructive pulmonary disease, unspecified: Secondary | ICD-10-CM | POA: Diagnosis not present

## 2015-08-23 DIAGNOSIS — Z7901 Long term (current) use of anticoagulants: Secondary | ICD-10-CM | POA: Diagnosis not present

## 2015-08-23 DIAGNOSIS — Z96642 Presence of left artificial hip joint: Secondary | ICD-10-CM | POA: Diagnosis not present

## 2015-08-23 DIAGNOSIS — M5442 Lumbago with sciatica, left side: Secondary | ICD-10-CM | POA: Diagnosis not present

## 2015-08-23 DIAGNOSIS — F17210 Nicotine dependence, cigarettes, uncomplicated: Secondary | ICD-10-CM | POA: Diagnosis not present

## 2015-08-23 DIAGNOSIS — G8929 Other chronic pain: Secondary | ICD-10-CM | POA: Diagnosis not present

## 2015-08-23 DIAGNOSIS — J449 Chronic obstructive pulmonary disease, unspecified: Secondary | ICD-10-CM | POA: Diagnosis not present

## 2015-08-23 DIAGNOSIS — Z471 Aftercare following joint replacement surgery: Secondary | ICD-10-CM | POA: Diagnosis not present

## 2015-08-27 DIAGNOSIS — M5442 Lumbago with sciatica, left side: Secondary | ICD-10-CM | POA: Diagnosis not present

## 2015-08-27 DIAGNOSIS — G8929 Other chronic pain: Secondary | ICD-10-CM | POA: Diagnosis not present

## 2015-08-27 DIAGNOSIS — Z7901 Long term (current) use of anticoagulants: Secondary | ICD-10-CM | POA: Diagnosis not present

## 2015-08-27 DIAGNOSIS — Z96642 Presence of left artificial hip joint: Secondary | ICD-10-CM | POA: Diagnosis not present

## 2015-08-27 DIAGNOSIS — Z471 Aftercare following joint replacement surgery: Secondary | ICD-10-CM | POA: Diagnosis not present

## 2015-08-27 DIAGNOSIS — J449 Chronic obstructive pulmonary disease, unspecified: Secondary | ICD-10-CM | POA: Diagnosis not present

## 2015-08-27 DIAGNOSIS — F17210 Nicotine dependence, cigarettes, uncomplicated: Secondary | ICD-10-CM | POA: Diagnosis not present

## 2015-08-28 DIAGNOSIS — G8929 Other chronic pain: Secondary | ICD-10-CM | POA: Diagnosis not present

## 2015-08-28 DIAGNOSIS — Z7901 Long term (current) use of anticoagulants: Secondary | ICD-10-CM | POA: Diagnosis not present

## 2015-08-28 DIAGNOSIS — J449 Chronic obstructive pulmonary disease, unspecified: Secondary | ICD-10-CM | POA: Diagnosis not present

## 2015-08-28 DIAGNOSIS — F17210 Nicotine dependence, cigarettes, uncomplicated: Secondary | ICD-10-CM | POA: Diagnosis not present

## 2015-08-28 DIAGNOSIS — Z96642 Presence of left artificial hip joint: Secondary | ICD-10-CM | POA: Diagnosis not present

## 2015-08-28 DIAGNOSIS — M5442 Lumbago with sciatica, left side: Secondary | ICD-10-CM | POA: Diagnosis not present

## 2015-08-28 DIAGNOSIS — Z471 Aftercare following joint replacement surgery: Secondary | ICD-10-CM | POA: Diagnosis not present

## 2015-08-31 DIAGNOSIS — Z96642 Presence of left artificial hip joint: Secondary | ICD-10-CM | POA: Diagnosis not present

## 2015-08-31 DIAGNOSIS — F17210 Nicotine dependence, cigarettes, uncomplicated: Secondary | ICD-10-CM | POA: Diagnosis not present

## 2015-08-31 DIAGNOSIS — Z7901 Long term (current) use of anticoagulants: Secondary | ICD-10-CM | POA: Diagnosis not present

## 2015-08-31 DIAGNOSIS — J449 Chronic obstructive pulmonary disease, unspecified: Secondary | ICD-10-CM | POA: Diagnosis not present

## 2015-08-31 DIAGNOSIS — G8929 Other chronic pain: Secondary | ICD-10-CM | POA: Diagnosis not present

## 2015-08-31 DIAGNOSIS — M5442 Lumbago with sciatica, left side: Secondary | ICD-10-CM | POA: Diagnosis not present

## 2015-08-31 DIAGNOSIS — M1612 Unilateral primary osteoarthritis, left hip: Secondary | ICD-10-CM | POA: Diagnosis not present

## 2015-08-31 DIAGNOSIS — Z471 Aftercare following joint replacement surgery: Secondary | ICD-10-CM | POA: Diagnosis not present

## 2015-09-03 DIAGNOSIS — Z96642 Presence of left artificial hip joint: Secondary | ICD-10-CM | POA: Diagnosis not present

## 2015-09-03 DIAGNOSIS — Z7901 Long term (current) use of anticoagulants: Secondary | ICD-10-CM | POA: Diagnosis not present

## 2015-09-03 DIAGNOSIS — M5442 Lumbago with sciatica, left side: Secondary | ICD-10-CM | POA: Diagnosis not present

## 2015-09-03 DIAGNOSIS — Z471 Aftercare following joint replacement surgery: Secondary | ICD-10-CM | POA: Diagnosis not present

## 2015-09-03 DIAGNOSIS — G8929 Other chronic pain: Secondary | ICD-10-CM | POA: Diagnosis not present

## 2015-09-03 DIAGNOSIS — F17210 Nicotine dependence, cigarettes, uncomplicated: Secondary | ICD-10-CM | POA: Diagnosis not present

## 2015-09-03 DIAGNOSIS — J449 Chronic obstructive pulmonary disease, unspecified: Secondary | ICD-10-CM | POA: Diagnosis not present

## 2015-09-05 DIAGNOSIS — J449 Chronic obstructive pulmonary disease, unspecified: Secondary | ICD-10-CM | POA: Diagnosis not present

## 2015-09-05 DIAGNOSIS — F17210 Nicotine dependence, cigarettes, uncomplicated: Secondary | ICD-10-CM | POA: Diagnosis not present

## 2015-09-05 DIAGNOSIS — Z471 Aftercare following joint replacement surgery: Secondary | ICD-10-CM | POA: Diagnosis not present

## 2015-09-05 DIAGNOSIS — G8929 Other chronic pain: Secondary | ICD-10-CM | POA: Diagnosis not present

## 2015-09-05 DIAGNOSIS — Z96642 Presence of left artificial hip joint: Secondary | ICD-10-CM | POA: Diagnosis not present

## 2015-09-05 DIAGNOSIS — M5442 Lumbago with sciatica, left side: Secondary | ICD-10-CM | POA: Diagnosis not present

## 2015-09-05 DIAGNOSIS — Z7901 Long term (current) use of anticoagulants: Secondary | ICD-10-CM | POA: Diagnosis not present

## 2015-09-08 DIAGNOSIS — G894 Chronic pain syndrome: Secondary | ICD-10-CM | POA: Diagnosis not present

## 2015-09-08 DIAGNOSIS — G2581 Restless legs syndrome: Secondary | ICD-10-CM | POA: Diagnosis not present

## 2015-09-08 DIAGNOSIS — E559 Vitamin D deficiency, unspecified: Secondary | ICD-10-CM | POA: Diagnosis not present

## 2015-09-08 DIAGNOSIS — J449 Chronic obstructive pulmonary disease, unspecified: Secondary | ICD-10-CM | POA: Diagnosis not present

## 2015-09-08 DIAGNOSIS — Z23 Encounter for immunization: Secondary | ICD-10-CM | POA: Diagnosis not present

## 2015-09-08 DIAGNOSIS — R7309 Other abnormal glucose: Secondary | ICD-10-CM | POA: Diagnosis not present

## 2015-09-08 DIAGNOSIS — Z1389 Encounter for screening for other disorder: Secondary | ICD-10-CM | POA: Diagnosis not present

## 2015-09-17 DIAGNOSIS — Z5181 Encounter for therapeutic drug level monitoring: Secondary | ICD-10-CM | POA: Diagnosis not present

## 2015-09-17 DIAGNOSIS — M545 Low back pain: Secondary | ICD-10-CM | POA: Diagnosis not present

## 2015-09-17 DIAGNOSIS — Z720 Tobacco use: Secondary | ICD-10-CM | POA: Diagnosis not present

## 2015-09-17 DIAGNOSIS — G8929 Other chronic pain: Secondary | ICD-10-CM | POA: Diagnosis not present

## 2015-09-17 DIAGNOSIS — G894 Chronic pain syndrome: Secondary | ICD-10-CM | POA: Diagnosis not present

## 2015-09-28 DIAGNOSIS — M1612 Unilateral primary osteoarthritis, left hip: Secondary | ICD-10-CM | POA: Diagnosis not present

## 2015-11-15 DIAGNOSIS — M4806 Spinal stenosis, lumbar region: Secondary | ICD-10-CM | POA: Diagnosis not present

## 2015-11-15 DIAGNOSIS — F17200 Nicotine dependence, unspecified, uncomplicated: Secondary | ICD-10-CM | POA: Diagnosis not present

## 2015-11-29 DIAGNOSIS — M1612 Unilateral primary osteoarthritis, left hip: Secondary | ICD-10-CM | POA: Diagnosis not present

## 2016-01-14 DIAGNOSIS — F17200 Nicotine dependence, unspecified, uncomplicated: Secondary | ICD-10-CM | POA: Diagnosis not present

## 2016-01-14 DIAGNOSIS — G8929 Other chronic pain: Secondary | ICD-10-CM | POA: Diagnosis not present

## 2016-01-14 DIAGNOSIS — M545 Low back pain: Secondary | ICD-10-CM | POA: Diagnosis not present

## 2016-02-08 DIAGNOSIS — J449 Chronic obstructive pulmonary disease, unspecified: Secondary | ICD-10-CM | POA: Diagnosis not present

## 2016-02-08 DIAGNOSIS — Z716 Tobacco abuse counseling: Secondary | ICD-10-CM | POA: Diagnosis not present

## 2016-02-08 DIAGNOSIS — J22 Unspecified acute lower respiratory infection: Secondary | ICD-10-CM | POA: Diagnosis not present

## 2016-02-08 DIAGNOSIS — M542 Cervicalgia: Secondary | ICD-10-CM | POA: Diagnosis not present

## 2016-02-12 DIAGNOSIS — Z716 Tobacco abuse counseling: Secondary | ICD-10-CM | POA: Diagnosis not present

## 2016-02-12 DIAGNOSIS — J22 Unspecified acute lower respiratory infection: Secondary | ICD-10-CM | POA: Diagnosis not present

## 2016-02-12 DIAGNOSIS — J449 Chronic obstructive pulmonary disease, unspecified: Secondary | ICD-10-CM | POA: Diagnosis not present

## 2016-02-12 DIAGNOSIS — M542 Cervicalgia: Secondary | ICD-10-CM | POA: Diagnosis not present

## 2016-03-07 DIAGNOSIS — G8929 Other chronic pain: Secondary | ICD-10-CM | POA: Diagnosis not present

## 2016-03-07 DIAGNOSIS — M545 Low back pain: Secondary | ICD-10-CM | POA: Diagnosis not present

## 2016-03-07 DIAGNOSIS — F17200 Nicotine dependence, unspecified, uncomplicated: Secondary | ICD-10-CM | POA: Diagnosis not present

## 2016-04-09 DIAGNOSIS — N644 Mastodynia: Secondary | ICD-10-CM | POA: Diagnosis not present

## 2016-04-09 DIAGNOSIS — M940 Chondrocostal junction syndrome [Tietze]: Secondary | ICD-10-CM | POA: Diagnosis not present

## 2016-04-10 DIAGNOSIS — N644 Mastodynia: Secondary | ICD-10-CM | POA: Diagnosis not present

## 2016-05-12 DIAGNOSIS — R7309 Other abnormal glucose: Secondary | ICD-10-CM | POA: Diagnosis not present

## 2016-05-12 DIAGNOSIS — E559 Vitamin D deficiency, unspecified: Secondary | ICD-10-CM | POA: Diagnosis not present

## 2016-05-12 DIAGNOSIS — Z0000 Encounter for general adult medical examination without abnormal findings: Secondary | ICD-10-CM | POA: Diagnosis not present

## 2016-05-12 DIAGNOSIS — J449 Chronic obstructive pulmonary disease, unspecified: Secondary | ICD-10-CM | POA: Diagnosis not present

## 2016-05-12 DIAGNOSIS — M545 Low back pain: Secondary | ICD-10-CM | POA: Diagnosis not present

## 2016-05-14 DIAGNOSIS — G8929 Other chronic pain: Secondary | ICD-10-CM | POA: Diagnosis not present

## 2016-05-14 DIAGNOSIS — M545 Low back pain: Secondary | ICD-10-CM | POA: Diagnosis not present

## 2016-05-14 DIAGNOSIS — F17200 Nicotine dependence, unspecified, uncomplicated: Secondary | ICD-10-CM | POA: Diagnosis not present

## 2016-05-19 ENCOUNTER — Other Ambulatory Visit (HOSPITAL_COMMUNITY): Payer: Self-pay | Admitting: Internal Medicine

## 2016-05-19 DIAGNOSIS — E213 Hyperparathyroidism, unspecified: Secondary | ICD-10-CM

## 2016-05-20 ENCOUNTER — Other Ambulatory Visit (HOSPITAL_COMMUNITY): Payer: Self-pay | Admitting: Internal Medicine

## 2016-06-02 ENCOUNTER — Encounter (HOSPITAL_COMMUNITY)
Admission: RE | Admit: 2016-06-02 | Discharge: 2016-06-02 | Disposition: A | Discharge status: Home / Self Care | Payer: Commercial Managed Care - HMO | Source: Ambulatory Visit | Attending: Internal Medicine | Admitting: Internal Medicine

## 2016-06-02 ENCOUNTER — Encounter (HOSPITAL_COMMUNITY): Payer: Commercial Managed Care - HMO

## 2016-06-02 DIAGNOSIS — E213 Hyperparathyroidism, unspecified: Secondary | ICD-10-CM

## 2016-06-02 MED ORDER — TECHNETIUM TC 99M SESTAMIBI - CARDIOLITE
26.4000 | Freq: Once | INTRAVENOUS | Status: AC | PRN
  Administered 2016-06-02: 26 via INTRAVENOUS

## 2016-07-07 DIAGNOSIS — Z79899 Other long term (current) drug therapy: Secondary | ICD-10-CM | POA: Diagnosis not present

## 2016-07-07 DIAGNOSIS — Z6823 Body mass index (BMI) 23.0-23.9, adult: Secondary | ICD-10-CM | POA: Diagnosis not present

## 2016-07-07 DIAGNOSIS — Z87891 Personal history of nicotine dependence: Secondary | ICD-10-CM | POA: Diagnosis not present

## 2016-07-07 DIAGNOSIS — J449 Chronic obstructive pulmonary disease, unspecified: Secondary | ICD-10-CM | POA: Diagnosis not present

## 2016-07-11 DIAGNOSIS — G894 Chronic pain syndrome: Secondary | ICD-10-CM | POA: Diagnosis not present

## 2016-07-11 DIAGNOSIS — M545 Low back pain: Secondary | ICD-10-CM | POA: Diagnosis not present

## 2016-07-11 DIAGNOSIS — G8929 Other chronic pain: Secondary | ICD-10-CM | POA: Diagnosis not present

## 2016-08-13 DIAGNOSIS — Z1231 Encounter for screening mammogram for malignant neoplasm of breast: Secondary | ICD-10-CM | POA: Diagnosis not present

## 2016-08-13 DIAGNOSIS — Z803 Family history of malignant neoplasm of breast: Secondary | ICD-10-CM | POA: Diagnosis not present

## 2016-10-17 DIAGNOSIS — Z87891 Personal history of nicotine dependence: Secondary | ICD-10-CM | POA: Diagnosis not present

## 2016-10-17 DIAGNOSIS — M436 Torticollis: Secondary | ICD-10-CM | POA: Diagnosis not present

## 2016-10-19 ENCOUNTER — Encounter (HOSPITAL_COMMUNITY): Payer: Self-pay | Admitting: Emergency Medicine

## 2016-10-19 ENCOUNTER — Emergency Department (HOSPITAL_COMMUNITY)
Admission: EM | Admit: 2016-10-19 | Discharge: 2016-10-19 | Disposition: A | Discharge status: Home / Self Care | Payer: Commercial Managed Care - HMO | Attending: Emergency Medicine | Admitting: Emergency Medicine

## 2016-10-19 DIAGNOSIS — Z7901 Long term (current) use of anticoagulants: Secondary | ICD-10-CM | POA: Insufficient documentation

## 2016-10-19 DIAGNOSIS — Z760 Encounter for issue of repeat prescription: Secondary | ICD-10-CM

## 2016-10-19 DIAGNOSIS — G8929 Other chronic pain: Secondary | ICD-10-CM | POA: Diagnosis not present

## 2016-10-19 DIAGNOSIS — J449 Chronic obstructive pulmonary disease, unspecified: Secondary | ICD-10-CM | POA: Diagnosis not present

## 2016-10-19 DIAGNOSIS — F17210 Nicotine dependence, cigarettes, uncomplicated: Secondary | ICD-10-CM | POA: Insufficient documentation

## 2016-10-19 DIAGNOSIS — M545 Low back pain: Secondary | ICD-10-CM | POA: Diagnosis not present

## 2016-10-19 DIAGNOSIS — Z96642 Presence of left artificial hip joint: Secondary | ICD-10-CM | POA: Diagnosis not present

## 2016-10-19 DIAGNOSIS — M549 Dorsalgia, unspecified: Secondary | ICD-10-CM

## 2016-10-19 MED ORDER — OXYCODONE HCL 5 MG PO TABS
10.0000 mg | ORAL_TABLET | Freq: Once | ORAL | Status: AC
  Administered 2016-10-19: 10 mg via ORAL
  Filled 2016-10-19: qty 2

## 2016-10-19 MED ORDER — OXYCODONE HCL 5 MG PO TABS: 5.0000 mg | ORAL_TABLET | Freq: Four times a day (QID) | ORAL | 0 refills | Status: AC | PRN

## 2016-10-19 NOTE — ED Triage Notes (Signed)
Pt. Stated, Im having back and leg pain, unable to get into pain clinic.  Not medicine since July because of a new clinic . I have not had any Oxycodone since September.

## 2016-10-19 NOTE — ED Notes (Signed)
Pt in bed family at bedside pt appears in no distress

## 2016-10-19 NOTE — Care Management (Signed)
CM spoke with patient regarding assistance with Pain Management referral. CM explained that referral would need to be placed through her PCP. She was requesting some local Pain Management Practices, CM provided the names and contact information for patient, patient verbalizes understanding  and was appreciative of the assistance. Patient advised to contact PCP office as soon as possible for the referral. No further CM needs identified.

## 2016-10-19 NOTE — ED Provider Notes (Signed)
[facility]-EMERGENCY DEPT Provider Note   CSN: [id number] Arrival date & time: 10/19/16  1341     History   Chief Complaint No chief complaint on file.   HPI Jenna Stanley is a 70 y.o. female.   Back Pain   This is a chronic problem. The problem occurs constantly. The problem has been gradually worsening. The pain is associated with no known injury. The pain is present in the lumbar spine and thoracic spine. The pain does not radiate. The pain is at a severity of 6/10. The pain is mild. The pain is worse during the day. Pertinent negatives include no chest pain, no abdominal pain, no abdominal swelling, no dysuria and no pelvic pain.    Past Medical History:  Diagnosis Date  . Anxiety   . Arthritis    "qwhere"  . Chronic bronchitis (HCC)    "used to get it q yr; haven't had it in 3-4 years" (08/16/2015)  . Chronic lower back pain   . COPD (chronic obstructive pulmonary disease) (HCC)   . Elevated troponin 12/2012   a.  in setting of viral illness (0.35 => < 0.30);   Lexiscan Myoview 01/07/13: No ischemia, EF 68%, normal stress nuclear study with normal wall motion.  . Fibromyalgia    hx  . Hepatitis C 1990's   "had treatment; it's gone now" (08/16/2015)  . History of echocardiogram    a.  2-D echo 01/04/13: EF 55-60%, grade 1 diastolic dysfunction, MAC, trivial MR, PASP 40  . Migraine    hx; went to chiropractor; gone now  . Pain management   . Pneumonia "several times"  . RLS (restless legs syndrome)   . Sciatica     Patient Active Problem List   Diagnosis Date Noted  . Osteoarthritis of left hip 08/15/2015  . Hip arthritis 08/15/2015  . Dyspnea on exertion 01/04/2013  . Elevated troponin 01/03/2013  . Fever 01/03/2013  . TOBACCO ABUSE 02/21/2009  . HYPERTENSION 02/21/2009  . ALLERGIC RHINITIS 02/21/2009  . COPD GOLD I  02/21/2009  . ATELECTASIS 02/21/2009    Past Surgical History:  Procedure Laterality Date  . ARTHRODESIS METATARSALPHALANGEAL JOINT (MTPJ) Left    Cheilectomy with first MTP joint implant,  . BILATERAL CARPAL TUNNEL RELEASE Bilateral 1980's  . JOINT REPLACEMENT    . MASS EXCISION Right    anterior subcutaneous shoulder mass   . TIBIAL SESAMOID EXCISION Left     arthritic  . TONSILLECTOMY AND ADENOIDECTOMY  1956  . TOTAL HIP ARTHROPLASTY Left 08/15/2015   Procedure: LEFT TOTAL HIP ARTHROPLASTY ANTERIOR APPROACH;  Surgeon: Tarry Kos, MD;  Location: [facility] OR;  Service: Orthopedics;  Laterality: Left;  Marland Kitchen VAGINAL HYSTERECTOMY  1980's    OB History    No data available       Home Medications    Prior to Admission medications   Medication Sig Start Date End Date Taking? Authorizing Provider  amitriptyline (ELAVIL) 25 MG tablet Take 25 mg by mouth at bedtime.   Yes Historical Provider, MD  budesonide-formoterol (SYMBICORT) 160-4.5 MCG/ACT inhaler Inhale 2 puffs into the lungs 2 (two) times daily.   Yes Historical Provider, MD  diclofenac sodium (VOLTAREN) 1 % GEL Apply 2 gm to painful joints QID prn for pain  (No more than 32 gm total per day) 02/01/15  Yes Historical Provider, MD  gabapentin (NEURONTIN) 300 MG capsule Take 600 mg by mouth 2 (two) times daily.    Yes Historical Provider, MD  enoxaparin (LOVENOX) 40  MG/0.4ML injection Inject 0.4 mLs (40 mg total) into the skin daily. Patient not taking: Reported on 10/19/2016 08/18/15   Kirtland Bouchard, PA-C  oxyCODONE (ROXICODONE) 5 MG immediate release tablet Take 1-2 tablets (5-10 mg total) by mouth every 6 (six) hours as needed for severe pain. 10/19/16   Marily Memos, MD  promethazine (PHENERGAN) 25 MG tablet Take 1 tablet (25 mg total) by mouth every 6 (six) hours as needed for nausea. Patient not taking: Reported on 10/19/2016 08/15/15   Tarry Kos, MD  senna-docusate (SENOKOT S) 8.6-50 MG per tablet Take 1 tablet by mouth at bedtime as needed. Patient not taking: Reported on 10/19/2016 08/15/15   Tarry Kos, MD    Family History Family History  Problem Relation Age of Onset    . Bladder Cancer Father   . Breast cancer Sister   . Breast cancer Sister     Social History Social History  Substance Use Topics  . Smoking status: Current Every Day Smoker    Packs/day: 0.50    Years: 55.00    Types: Cigarettes  . Smokeless tobacco: Never Used  . Alcohol use 0.0 oz/week     Comment: QUIT 1989     Allergies   Nsaids   Review of Systems Review of Systems  Cardiovascular: Negative for chest pain.  Gastrointestinal: Negative for abdominal pain.  Genitourinary: Negative for dysuria and pelvic pain.  Musculoskeletal: Positive for back pain (radiates down back of legs).  All other systems reviewed and are negative.    Physical Exam Updated Vital Signs BP 156/88 (BP Location: Right Arm)   Pulse 90   Temp 98 F (36.7 C)   Resp 18   SpO2 96%   Physical Exam  Constitutional: She is oriented to person, place, and time. She appears well-developed and well-nourished.  HENT:  Head: Normocephalic and atraumatic.  Eyes: Conjunctivae and EOM are normal.  Neck: Normal range of motion.  Cardiovascular: Normal rate and regular rhythm.   Pulmonary/Chest: Effort normal. No stridor. No respiratory distress. She has no wheezes.  Abdominal: Soft. She exhibits no distension. There is no tenderness.  Musculoskeletal: She exhibits no edema or deformity.  Neurological: She is alert and oriented to person, place, and time. No cranial nerve deficit. Coordination normal.  Lower extremity with normal motor, sensation and reflexes.  Nursing note and vitals reviewed.    ED Treatments / Results  Labs (all labs ordered are listed, but only abnormal results are displayed) Labs Reviewed - No data to display  EKG  EKG Interpretation None       Radiology No results found.  Procedures Procedures (including critical care time)  Medications Ordered in ED Medications  oxyCODONE (Oxy IR/ROXICODONE) immediate release tablet 10 mg (10 mg Oral Given 10/19/16 1637)      Initial Impression / Assessment and Plan / ED Course  I have reviewed the triage vital signs and the nursing notes.  Pertinent labs & imaging results that were available during my care of the patient were reviewed by me and considered in my medical decision making (see chart for details).  Clinical Course    70 yo F who is out of her pain medications and can't get into her pain clinic for her first appointment for the next 2 weeks. Verified her story on [place] substance reporting system and on notes in system. Will give rx for one week of pain meds.  Doubt back pain is new. Only red flag is age, but  no indication for XR's at this time.  Engaged care management who will try to help patient get a follow up appointment in a pain clinic sooner than currently scheduled.   Final Clinical Impressions(s) / ED Diagnoses   Final diagnoses:  Chronic back pain, unspecified back location, unspecified back pain laterality  Encounter for medication refill    New Prescriptions New Prescriptions   OXYCODONE (ROXICODONE) 5 MG IMMEDIATE RELEASE TABLET    Take 1-2 tablets (5-10 mg total) by mouth every 6 (six) hours as needed for severe pain.     Marily MemosJason Bernadine Melecio, MD 10/19/16 1745

## 2016-11-05 DIAGNOSIS — Z79899 Other long term (current) drug therapy: Secondary | ICD-10-CM | POA: Diagnosis not present

## 2016-11-05 DIAGNOSIS — M545 Low back pain: Secondary | ICD-10-CM | POA: Diagnosis not present

## 2016-11-05 DIAGNOSIS — F17210 Nicotine dependence, cigarettes, uncomplicated: Secondary | ICD-10-CM | POA: Diagnosis not present

## 2016-11-05 DIAGNOSIS — G894 Chronic pain syndrome: Secondary | ICD-10-CM | POA: Diagnosis not present

## 2016-12-15 DIAGNOSIS — M545 Low back pain: Secondary | ICD-10-CM | POA: Diagnosis not present

## 2017-01-29 DIAGNOSIS — 419620001 Death: Secondary | SNOMED CT | POA: Diagnosis not present

## 2017-01-29 DEATH — deceased
